# Patient Record
Sex: Male | Born: 1950 | Race: Black or African American | Hispanic: No | Marital: Married | Smoking: Current some day smoker
Health system: Southern US, Community
[De-identification: ages and names within clinical notes are randomized; demographics above are authoritative.]

## PROBLEM LIST (undated history)

## (undated) DIAGNOSIS — R7611 Nonspecific reaction to tuberculin skin test without active tuberculosis: Secondary | ICD-10-CM

## (undated) DIAGNOSIS — I10 Essential (primary) hypertension: Secondary | ICD-10-CM

## (undated) DIAGNOSIS — E785 Hyperlipidemia, unspecified: Secondary | ICD-10-CM

## (undated) HISTORY — DX: Hyperlipidemia, unspecified: E78.5

## (undated) HISTORY — DX: Essential (primary) hypertension: I10

## (undated) HISTORY — DX: Nonspecific reaction to tuberculin skin test without active tuberculosis: R76.11

---

## 1997-10-03 ENCOUNTER — Emergency Department (HOSPITAL_COMMUNITY): Admission: EM | Admit: 1997-10-03 | Discharge: 1997-10-03 | Payer: Self-pay | Admitting: Emergency Medicine

## 2003-06-30 ENCOUNTER — Encounter: Admission: RE | Admit: 2003-06-30 | Discharge: 2003-06-30 | Payer: Self-pay | Admitting: Family Medicine

## 2005-02-28 ENCOUNTER — Ambulatory Visit: Payer: Self-pay | Admitting: Gastroenterology

## 2005-03-24 ENCOUNTER — Ambulatory Visit: Payer: Self-pay | Admitting: Gastroenterology

## 2005-03-24 LAB — HM COLONOSCOPY: HM Colonoscopy: NORMAL

## 2005-05-15 ENCOUNTER — Ambulatory Visit: Payer: Self-pay | Admitting: Gastroenterology

## 2005-05-26 ENCOUNTER — Encounter: Admission: RE | Admit: 2005-05-26 | Discharge: 2005-05-26 | Payer: Self-pay | Admitting: Family Medicine

## 2005-08-11 ENCOUNTER — Emergency Department (HOSPITAL_COMMUNITY): Admission: EM | Admit: 2005-08-11 | Discharge: 2005-08-11 | Payer: Self-pay | Admitting: Emergency Medicine

## 2005-08-21 ENCOUNTER — Emergency Department (HOSPITAL_COMMUNITY): Admission: EM | Admit: 2005-08-21 | Discharge: 2005-08-21 | Payer: Self-pay | Admitting: Emergency Medicine

## 2006-09-07 ENCOUNTER — Ambulatory Visit: Payer: Self-pay | Admitting: Family Medicine

## 2007-03-01 ENCOUNTER — Ambulatory Visit: Payer: Self-pay | Admitting: Family Medicine

## 2007-04-16 ENCOUNTER — Ambulatory Visit: Payer: Self-pay | Admitting: Family Medicine

## 2007-09-09 ENCOUNTER — Ambulatory Visit: Payer: Self-pay | Admitting: Family Medicine

## 2008-09-07 ENCOUNTER — Ambulatory Visit: Payer: Self-pay | Admitting: Family Medicine

## 2008-09-18 ENCOUNTER — Ambulatory Visit: Payer: Self-pay | Admitting: Family Medicine

## 2008-10-06 ENCOUNTER — Ambulatory Visit: Payer: Self-pay | Admitting: Family Medicine

## 2008-10-12 ENCOUNTER — Encounter: Admission: RE | Admit: 2008-10-12 | Discharge: 2008-10-12 | Payer: Self-pay | Admitting: Family Medicine

## 2008-12-29 ENCOUNTER — Encounter: Admission: RE | Admit: 2008-12-29 | Discharge: 2008-12-29 | Payer: Self-pay | Admitting: Family Medicine

## 2008-12-29 ENCOUNTER — Ambulatory Visit: Payer: Self-pay | Admitting: Family Medicine

## 2009-06-21 ENCOUNTER — Ambulatory Visit: Payer: Self-pay | Admitting: Family Medicine

## 2009-09-09 ENCOUNTER — Ambulatory Visit: Payer: Self-pay | Admitting: Family Medicine

## 2010-09-09 ENCOUNTER — Encounter: Payer: Self-pay | Admitting: Family Medicine

## 2010-09-13 ENCOUNTER — Ambulatory Visit (INDEPENDENT_AMBULATORY_CARE_PROVIDER_SITE_OTHER): Payer: No Typology Code available for payment source | Admitting: Family Medicine

## 2010-09-13 ENCOUNTER — Encounter: Payer: Self-pay | Admitting: Family Medicine

## 2010-09-13 ENCOUNTER — Other Ambulatory Visit: Payer: Self-pay | Admitting: Family Medicine

## 2010-09-13 VITALS — BP 124/90 | HR 88 | Ht 68.0 in | Wt 159.0 lb

## 2010-09-13 DIAGNOSIS — Z2911 Encounter for prophylactic immunotherapy for respiratory syncytial virus (RSV): Secondary | ICD-10-CM

## 2010-09-13 DIAGNOSIS — Z79899 Other long term (current) drug therapy: Secondary | ICD-10-CM

## 2010-09-13 DIAGNOSIS — T24229A Burn of second degree of unspecified knee, initial encounter: Secondary | ICD-10-CM

## 2010-09-13 DIAGNOSIS — N529 Male erectile dysfunction, unspecified: Secondary | ICD-10-CM | POA: Insufficient documentation

## 2010-09-13 DIAGNOSIS — Z0000 Encounter for general adult medical examination without abnormal findings: Secondary | ICD-10-CM

## 2010-09-13 DIAGNOSIS — Z209 Contact with and (suspected) exposure to unspecified communicable disease: Secondary | ICD-10-CM

## 2010-09-13 DIAGNOSIS — R195 Other fecal abnormalities: Secondary | ICD-10-CM

## 2010-09-13 DIAGNOSIS — I10 Essential (primary) hypertension: Secondary | ICD-10-CM | POA: Insufficient documentation

## 2010-09-13 DIAGNOSIS — T24222A Burn of second degree of left knee, initial encounter: Secondary | ICD-10-CM

## 2010-09-13 LAB — POCT URINALYSIS DIPSTICK
Blood, UA: NEGATIVE
Glucose, UA: NEGATIVE
Leukocytes, UA: NEGATIVE
Nitrite, UA: NEGATIVE
Spec Grav, UA: 1.02
Urobilinogen, UA: NEGATIVE

## 2010-09-13 LAB — COMPREHENSIVE METABOLIC PANEL
ALT: 35 U/L (ref 0–53)
AST: 57 U/L — ABNORMAL HIGH (ref 0–37)
Albumin: 4.6 g/dL (ref 3.5–5.2)
Alkaline Phosphatase: 65 U/L (ref 39–117)
BUN: 10 mg/dL (ref 6–23)
Creat: 0.8 mg/dL (ref 0.50–1.35)
Glucose, Bld: 118 mg/dL — ABNORMAL HIGH (ref 70–99)
Sodium: 135 mEq/L (ref 135–145)
Total Protein: 7.8 g/dL (ref 6.0–8.3)

## 2010-09-13 LAB — LIPID PANEL
Cholesterol: 232 mg/dL — ABNORMAL HIGH (ref 0–200)
HDL: 76 mg/dL (ref >39)
Total CHOL/HDL Ratio: 3.1 Ratio

## 2010-09-13 MED ORDER — AMLODIPINE BESYLATE 10 MG PO TABS: 10.0000 mg | ORAL_TABLET | Freq: Every day | ORAL | Status: DC

## 2010-09-13 NOTE — Patient Instructions (Addendum)
To help with your problem, cut back on alcohol and smoking. Let he know how the medication works.
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
## 2010-09-14 LAB — CBC WITH DIFFERENTIAL/PLATELET
Eosinophils Relative: 1 % (ref 0–5)
Hemoglobin: 14.1 g/dL (ref 13.0–17.0)
MCH: 29.9 pg (ref 26.0–34.0)
MCHC: 33.7 g/dL (ref 30.0–36.0)
Monocytes Absolute: 0.6 10*3/uL (ref 0.1–1.0)
Monocytes Relative: 8 % (ref 3–12)
Neutro Abs: 5.2 10*3/uL (ref 1.7–7.7)
Neutrophils Relative %: 72 % (ref 43–77)
RBC: 4.71 MIL/uL (ref 4.22–5.81)

## 2010-09-14 LAB — HEMOGLOBIN A1C: Mean Plasma Glucose: 117 mg/dL — ABNORMAL HIGH (ref <117)

## 2010-09-15 ENCOUNTER — Telehealth: Payer: Self-pay

## 2010-09-15 NOTE — Telephone Encounter (Signed)
Left message for pt labs okay but sending copy of labs and chiolesterol diet info

## 2010-09-19 ENCOUNTER — Other Ambulatory Visit: Payer: Self-pay | Admitting: Family Medicine

## 2010-09-19 MED ORDER — VARDENAFIL HCL 10 MG PO TBDP: 1.0000 | ORAL_TABLET | ORAL | Status: DC

## 2010-09-28 ENCOUNTER — Telehealth: Payer: Self-pay | Admitting: Family Medicine

## 2010-09-28 NOTE — Telephone Encounter (Signed)
Pt has appt Monday aug 20th at 10 30 with dr hung

## 2010-09-28 NOTE — Telephone Encounter (Signed)
Pt has appt Monday 20th @ 1030 pt is aware

## 2010-10-17 HISTORY — PX: COLONOSCOPY: SHX174

## 2010-10-27 LAB — HM COLONOSCOPY

## 2010-10-28 ENCOUNTER — Encounter: Payer: Self-pay | Admitting: Family Medicine

## 2011-02-24 ENCOUNTER — Telehealth: Payer: Self-pay | Admitting: Family Medicine

## 2011-02-24 NOTE — Telephone Encounter (Signed)
Have him check with his insurance company's to see which one they cover better. Let him know there are no generics

## 2011-02-24 NOTE — Telephone Encounter (Signed)
Left message for pt to call ins company and find out what they cover and call us back when he finds out

## 2011-04-27 ENCOUNTER — Other Ambulatory Visit: Payer: Self-pay | Admitting: Family Medicine

## 2011-09-07 ENCOUNTER — Encounter: Payer: Self-pay | Admitting: Internal Medicine

## 2011-09-14 ENCOUNTER — Encounter: Payer: Self-pay | Admitting: Family Medicine

## 2011-09-14 ENCOUNTER — Ambulatory Visit (INDEPENDENT_AMBULATORY_CARE_PROVIDER_SITE_OTHER): Payer: No Typology Code available for payment source | Admitting: Family Medicine

## 2011-09-14 VITALS — BP 116/80 | HR 78 | Ht 68.0 in | Wt 172.0 lb

## 2011-09-14 DIAGNOSIS — E785 Hyperlipidemia, unspecified: Secondary | ICD-10-CM

## 2011-09-14 DIAGNOSIS — R7309 Other abnormal glucose: Secondary | ICD-10-CM

## 2011-09-14 DIAGNOSIS — F17200 Nicotine dependence, unspecified, uncomplicated: Secondary | ICD-10-CM

## 2011-09-14 DIAGNOSIS — Z9079 Acquired absence of other genital organ(s): Secondary | ICD-10-CM

## 2011-09-14 DIAGNOSIS — Z0000 Encounter for general adult medical examination without abnormal findings: Secondary | ICD-10-CM

## 2011-09-14 DIAGNOSIS — R7302 Impaired glucose tolerance (oral): Secondary | ICD-10-CM

## 2011-09-14 LAB — LIPID PANEL
Cholesterol: 212 mg/dL — ABNORMAL HIGH (ref 0–200)
HDL: 50 mg/dL (ref >39)
LDL Cholesterol: 145 mg/dL — ABNORMAL HIGH (ref 0–99)
Triglycerides: 84 mg/dL (ref <150)
VLDL: 17 mg/dL (ref 0–40)

## 2011-09-14 LAB — CBC WITH DIFFERENTIAL/PLATELET
Basophils Absolute: 0 10*3/uL (ref 0.0–0.1)
Basophils Relative: 0 % (ref 0–1)
Eosinophils Relative: 4 % (ref 0–5)
Hemoglobin: 13.2 g/dL (ref 13.0–17.0)
Lymphocytes Relative: 31 % (ref 12–46)
MCHC: 35 g/dL (ref 30.0–36.0)
Monocytes Absolute: 0.5 10*3/uL (ref 0.1–1.0)
Monocytes Relative: 10 % (ref 3–12)
RBC: 4.52 MIL/uL (ref 4.22–5.81)
WBC: 5 10*3/uL (ref 4.0–10.5)

## 2011-09-14 LAB — POCT URINALYSIS DIPSTICK
Bilirubin, UA: NEGATIVE
Blood, UA: NEGATIVE
Glucose, UA: NEGATIVE
Leukocytes, UA: NEGATIVE
Nitrite, UA: NEGATIVE
Urobilinogen, UA: NEGATIVE

## 2011-09-14 LAB — COMPREHENSIVE METABOLIC PANEL
AST: 29 U/L (ref 0–37)
Alkaline Phosphatase: 48 U/L (ref 39–117)
BUN: 8 mg/dL (ref 6–23)
Creat: 0.73 mg/dL (ref 0.50–1.35)
Glucose, Bld: 100 mg/dL — ABNORMAL HIGH (ref 70–99)
Potassium: 4.2 mEq/L (ref 3.5–5.3)
Total Protein: 7.2 g/dL (ref 6.0–8.3)

## 2011-09-14 NOTE — Assessment & Plan Note (Signed)
Undescended right testes with removal at age 61

## 2011-09-14 NOTE — Progress Notes (Signed)
  Subjective:    Patient ID: William Blackwell, male    DOB: 1951/01/05, 61 y.o.   MRN: [id number]  HPI He is here for a complete examination. He continues on his blood pressure medications and is having no difficulty with them.he will occasionally use his ED medication. He does have lesions present in right nostril as well as on the inside of his mouth that he would like me to evaluate.  Review of Systems  Constitutional: Negative.   HENT: Negative.   Eyes: Negative.   Respiratory: Negative.   Cardiovascular: Negative.   Gastrointestinal: Negative.   Genitourinary: Negative.   Musculoskeletal: Negative.   Skin: Negative.   Neurological: Negative.   Hematological: Negative.   Psychiatric/Behavioral: Negative.        Objective:   Physical Exam BP 116/80  Pulse 78  Ht 5\' 8"  (1.727 m)  Wt 172 lb (78.019 kg)  BMI 26.15 kg/m2  SpO2 99%  General Appearance:    Alert, cooperative, no distress, appears stated age  Head:    Normocephalic, without obvious abnormality, atraumatic  Eyes:    PERRL, conjunctiva/corneas clear, EOM's intact, fundi    benign  Ears:    Normal TM's and external ear canals  Nose:   Nares normal, mucosa normal, no drainage or sinus   Tenderness.the tip of the right nares does have a healing lesion.   Throat:   Lips, and tongue normal; teeth and gums normal.0.1 cm lesions noted in the left lip near the corner of the mouth   Neck:   Supple, no lymphadenopathy;  thyroid:  no   enlargement/tenderness/nodules; no carotid   bruit or JVD  Back:    Spine nontender, no curvature, ROM normal, no CVA     tenderness  Lungs:     Clear to auscultation bilaterally without wheezes, rales or     ronchi; respirations unlabored  Chest Wall:    No tenderness or deformity   Heart:    Regular rate and rhythm, S1 and S2 normal, no murmur, rub   or gallop  Breast Exam:    No chest wall tenderness, masses or gynecomastia  Abdomen:     Soft, non-tender, nondistended, normoactive  bowel sounds,    no masses, no hepatosplenomegaly  Genitalia:    Normal male external genitalia without lesions. Left Testicle without masses, right testes missing No inguinal hernias.  Rectal:    deferred   Extremities:   No clubbing, cyanosis or edema  Pulses:   2+ and symmetric all extremities  Skin:   Skin color, texture, turgor normal, no rashes or lesions  Lymph nodes:   Cervical, supraclavicular, and axillary nodes normal  Neurologic:   CNII-XII intact, normal strength, sensation and gait; reflexes 2+ and symmetric throughout          Psych:   Normal mood, affect, hygiene and grooming.      EKG :WNL    Assessment & Plan:   1. Routine general medical examination at a health care facility  POCT Urinalysis Dipstick, PR ELECTROCARDIOGRAM, COMPLETE, CBC with Differential, Comprehensive metabolic panel, Lipid panel  2. S/P orchiectomy    3. Glucose intolerance (impaired glucose tolerance)  Comprehensive metabolic panel  4. Hyperlipidemia LDL goal < 100  Lipid panel  5. Current smoker    6 ED the nasal and lip lesions appear benign.

## 2011-09-15 NOTE — Progress Notes (Signed)
Mailed pt copy of labs with diet info

## 2011-11-11 ENCOUNTER — Other Ambulatory Visit: Payer: Self-pay | Admitting: Family Medicine

## 2011-11-13 NOTE — Telephone Encounter (Signed)
Is this ok?

## 2011-11-27 ENCOUNTER — Encounter: Payer: Self-pay | Admitting: Family Medicine

## 2011-11-27 ENCOUNTER — Ambulatory Visit (INDEPENDENT_AMBULATORY_CARE_PROVIDER_SITE_OTHER): Payer: No Typology Code available for payment source | Admitting: Family Medicine

## 2011-11-27 VITALS — BP 114/70 | HR 86 | Wt 174.0 lb

## 2011-11-27 DIAGNOSIS — M549 Dorsalgia, unspecified: Secondary | ICD-10-CM

## 2011-11-27 MED ORDER — VARDENAFIL HCL 10 MG PO TABS: 20.0000 mg | ORAL_TABLET | ORAL | Status: DC | PRN

## 2011-11-27 NOTE — Patient Instructions (Signed)
2 Aleve twice a day, heat for 20 minutes 3 times per day and after you are done  with heat do some retching for the next week or 2. If no improvement return here

## 2011-11-27 NOTE — Progress Notes (Signed)
  Subjective:    Patient ID: William Blackwell, male    DOB: [birth year], 61 y.o.   MRN: [id number]  HPI He has a one-week history of right hip pain that is now radiating down his leg. No history of,. No numbness, tingling or weakness. He has not taken anything to help this.   Review of Systems     Objective:   Physical Exam Alert and in no distress. No tenderness over lumbar spine or SI joints. Good motion of his spine to questionably positive stork test. DTRs normal. Straight leg raising negative.       Assessment & Plan:  Back pain possible sacroiliitis. Recommend heat, stretching and anti-inflammatory. Continued difficulty, he should return here.

## 2011-11-30 ENCOUNTER — Ambulatory Visit
Admission: RE | Admit: 2011-11-30 | Discharge: 2011-11-30 | Disposition: A | Discharge status: Home / Self Care | Payer: No Typology Code available for payment source | Source: Ambulatory Visit | Attending: Family Medicine | Admitting: Family Medicine

## 2011-11-30 ENCOUNTER — Ambulatory Visit (INDEPENDENT_AMBULATORY_CARE_PROVIDER_SITE_OTHER): Payer: No Typology Code available for payment source | Admitting: Family Medicine

## 2011-11-30 ENCOUNTER — Encounter: Payer: Self-pay | Admitting: Family Medicine

## 2011-11-30 VITALS — BP 110/70 | HR 76 | Wt 174.0 lb

## 2011-11-30 DIAGNOSIS — M549 Dorsalgia, unspecified: Secondary | ICD-10-CM

## 2011-11-30 DIAGNOSIS — N529 Male erectile dysfunction, unspecified: Secondary | ICD-10-CM

## 2011-11-30 MED ORDER — SILDENAFIL CITRATE 100 MG PO TABS: 50.0000 mg | ORAL_TABLET | Freq: Every day | ORAL | Status: DC | PRN

## 2011-11-30 MED ORDER — TRAMADOL HCL 50 MG PO TABS: 50.0000 mg | ORAL_TABLET | Freq: Four times a day (QID) | ORAL | Status: DC | PRN

## 2011-11-30 NOTE — Progress Notes (Signed)
  Subjective:    Patient ID: William Blackwell, male    DOB: Jun 24, 1950, 61 y.o.   MRN: [id number]  HPI He was seen several days ago and treated conservatively for his back pain. He has been taking 2 Aleve twice per day with no relief. He has been using heat for 20 minutes twice per day. He states that the pain does radiate down his legs. He demonstrated this by bending over and touching his toes. He also received a letter from his insurance coming stating that they will no longer pay for Levitra. He has tried fiber in the past which has worked.   Review of Systems     Objective:   Physical Exam Alert and in no distress. No tenderness to palpation. No rash noted. Negative straight leg raising. Normal hip motion. Reflexes were difficult to assess.       Assessment & Plan:   1. Back pain  DG Lumbar Spine Complete, traMADol (ULTRAM) 50 MG tablet  2. ED (erectile dysfunction)  sildenafil (VIAGRA) 100 MG tablet

## 2011-12-28 ENCOUNTER — Other Ambulatory Visit: Payer: Self-pay | Admitting: Orthopedic Surgery

## 2011-12-28 DIAGNOSIS — M545 Low back pain: Secondary | ICD-10-CM

## 2011-12-31 ENCOUNTER — Ambulatory Visit
Admission: RE | Admit: 2011-12-31 | Discharge: 2011-12-31 | Disposition: A | Discharge status: Home / Self Care | Payer: No Typology Code available for payment source | Source: Ambulatory Visit | Attending: Orthopedic Surgery | Admitting: Orthopedic Surgery

## 2011-12-31 DIAGNOSIS — M545 Low back pain: Secondary | ICD-10-CM

## 2012-02-19 ENCOUNTER — Institutional Professional Consult (permissible substitution): Payer: No Typology Code available for payment source | Admitting: Family Medicine

## 2012-05-04 ENCOUNTER — Other Ambulatory Visit: Payer: Self-pay | Admitting: Family Medicine

## 2012-08-23 ENCOUNTER — Ambulatory Visit (INDEPENDENT_AMBULATORY_CARE_PROVIDER_SITE_OTHER): Payer: 59 | Admitting: Family Medicine

## 2012-08-23 ENCOUNTER — Encounter: Payer: Self-pay | Admitting: Family Medicine

## 2012-08-23 VITALS — BP 128/86 | HR 90 | Wt 178.0 lb

## 2012-08-23 DIAGNOSIS — N529 Male erectile dysfunction, unspecified: Secondary | ICD-10-CM

## 2012-08-23 DIAGNOSIS — I10 Essential (primary) hypertension: Secondary | ICD-10-CM

## 2012-08-23 DIAGNOSIS — Z0000 Encounter for general adult medical examination without abnormal findings: Secondary | ICD-10-CM

## 2012-08-23 DIAGNOSIS — Z209 Contact with and (suspected) exposure to unspecified communicable disease: Secondary | ICD-10-CM

## 2012-08-23 LAB — POCT URINALYSIS DIPSTICK
Glucose, UA: NEGATIVE
Spec Grav, UA: 1.015

## 2012-08-23 LAB — CBC WITH DIFFERENTIAL/PLATELET
Basophils Relative: 0 % (ref 0–1)
Eosinophils Relative: 1 % (ref 0–5)
HCT: 36.4 % — ABNORMAL LOW (ref 39.0–52.0)
Hemoglobin: 12.6 g/dL — ABNORMAL LOW (ref 13.0–17.0)
MCHC: 34.6 g/dL (ref 30.0–36.0)
MCV: 81.1 fL (ref 78.0–100.0)
Monocytes Absolute: 0.5 10*3/uL (ref 0.1–1.0)
Platelets: 263 10*3/uL (ref 150–400)
RBC: 4.49 MIL/uL (ref 4.22–5.81)
RDW: 14.2 % (ref 11.5–15.5)

## 2012-08-23 LAB — HIV ANTIBODY (ROUTINE TESTING W REFLEX): HIV: NONREACTIVE

## 2012-08-23 LAB — LIPID PANEL
Cholesterol: 192 mg/dL (ref 0–200)
LDL Cholesterol: 132 mg/dL — ABNORMAL HIGH (ref 0–99)
Triglycerides: 87 mg/dL (ref <150)

## 2012-08-23 LAB — COMPREHENSIVE METABOLIC PANEL
ALT: 29 U/L (ref 0–53)
BUN: 9 mg/dL (ref 6–23)
Calcium: 9.7 mg/dL (ref 8.4–10.5)
Sodium: 138 mEq/L (ref 135–145)
Total Protein: 7.3 g/dL (ref 6.0–8.3)

## 2012-08-23 NOTE — Progress Notes (Signed)
  Subjective:    Patient ID: William Blackwell, male    DOB: 07/08/1950, 62 y.o.   MRN: [id number]  HPI He is here for complete examination. He has enjoyed excellent health and has no major concerns. He continues on his blood pressure medication without difficulty. He does occasionally use I agree. He is dating to different people at the present time and would like to be STD testing. Social and family history were reviewed. Work is going well. He does plan on retiring within the next several years.   Review of Systems  Constitutional: Negative.   HENT: Negative.   Eyes: Negative.   Respiratory: Negative.   Cardiovascular: Negative.   Gastrointestinal: Negative.   Endocrine: Negative.   Genitourinary: Negative.   Allergic/Immunologic: Negative.   Neurological: Negative.   Hematological: Negative.   Psychiatric/Behavioral: Negative.        Objective:   Physical Exam BP 128/86  Pulse 90  Wt 178 lb (80.74 kg)  BMI 27.07 kg/m2  SpO2 98%  General Appearance:    Alert, cooperative, no distress, appears stated age  Head:    Normocephalic, without obvious abnormality, atraumatic  Eyes:    PERRL, conjunctiva/corneas clear, EOM's intact, fundi    benign  Ears:    Normal TM's and external ear canals  Nose:   Nares normal, mucosa normal, no drainage or sinus   tenderness  Throat:   Lips, mucosa, and tongue normal; teeth and gums normal  Neck:   Supple, no lymphadenopathy;  thyroid:  no   enlargement/tenderness/nodules; no carotid   bruit or JVD  Back:    Spine nontender, no curvature, ROM normal, no CVA     tenderness  Lungs:     Clear to auscultation bilaterally without wheezes, rales or     ronchi; respirations unlabored  Chest Wall:    No tenderness or deformity   Heart:    Regular rate and rhythm, S1 and S2 normal, no murmur, rub   or gallop  Breast Exam:    No chest wall tenderness, masses or gynecomastia  Abdomen:     Soft, non-tender, nondistended, normoactive bowel sounds,     no masses, no hepatosplenomegaly  Genitalia:  deferred  Rectal:   deferred   Extremities:   No clubbing, cyanosis or edema  Pulses:   2+ and symmetric all extremities  Skin:   Skin color, texture, turgor normal, no rashes or lesions  Lymph nodes:   Cervical, supraclavicular, and axillary nodes normal  Neurologic:   CNII-XII intact, normal strength, sensation and gait; reflexes 2+ and symmetric throughout          Psych:   Normal mood, affect, hygiene and grooming.          Assessment & Plan:  Hypertension - Plan: POCT Urinalysis Dipstick  ED (erectile dysfunction)  Routine general medical examination at a health care facility - Plan: CBC with Differential, Comprehensive metabolic panel, Lipid panel  Contact with or exposure to unspecified communicable disease - Plan: HIV antibody, RPR

## 2012-08-26 NOTE — Progress Notes (Signed)
Quick Note:  Sent pt letter of results ______ 

## 2012-11-29 ENCOUNTER — Telehealth: Payer: Self-pay | Admitting: Family Medicine

## 2012-11-29 DIAGNOSIS — N529 Male erectile dysfunction, unspecified: Secondary | ICD-10-CM

## 2012-11-29 NOTE — Telephone Encounter (Signed)
Pt needs refill on Viagra send to [facility]. Pt was informed that JCL not in office and he stated ok until Monday so sending it to him.

## 2012-11-30 MED ORDER — SILDENAFIL CITRATE 100 MG PO TABS: 100.0000 mg | ORAL_TABLET | Freq: Every day | ORAL | Status: DC | PRN

## 2013-03-24 ENCOUNTER — Emergency Department (HOSPITAL_BASED_OUTPATIENT_CLINIC_OR_DEPARTMENT_OTHER)
Admission: EM | Admit: 2013-03-24 | Discharge: 2013-03-24 | Disposition: A | Discharge status: Home / Self Care | Attending: Emergency Medicine | Admitting: Emergency Medicine

## 2013-03-24 ENCOUNTER — Encounter (HOSPITAL_BASED_OUTPATIENT_CLINIC_OR_DEPARTMENT_OTHER): Payer: Self-pay | Admitting: Emergency Medicine

## 2013-03-24 ENCOUNTER — Ambulatory Visit (INDEPENDENT_AMBULATORY_CARE_PROVIDER_SITE_OTHER): Payer: 59 | Admitting: Family Medicine

## 2013-03-24 ENCOUNTER — Encounter: Payer: Self-pay | Admitting: Family Medicine

## 2013-03-24 VITALS — BP 130/78 | HR 72 | Wt 159.0 lb

## 2013-03-24 DIAGNOSIS — I10 Essential (primary) hypertension: Secondary | ICD-10-CM | POA: Insufficient documentation

## 2013-03-24 DIAGNOSIS — F17200 Nicotine dependence, unspecified, uncomplicated: Secondary | ICD-10-CM | POA: Diagnosis not present

## 2013-03-24 DIAGNOSIS — Z79899 Other long term (current) drug therapy: Secondary | ICD-10-CM | POA: Diagnosis not present

## 2013-03-24 DIAGNOSIS — Z862 Personal history of diseases of the blood and blood-forming organs and certain disorders involving the immune mechanism: Secondary | ICD-10-CM | POA: Diagnosis not present

## 2013-03-24 DIAGNOSIS — Z8639 Personal history of other endocrine, nutritional and metabolic disease: Secondary | ICD-10-CM | POA: Insufficient documentation

## 2013-03-24 DIAGNOSIS — M549 Dorsalgia, unspecified: Secondary | ICD-10-CM

## 2013-03-24 DIAGNOSIS — Z7982 Long term (current) use of aspirin: Secondary | ICD-10-CM | POA: Insufficient documentation

## 2013-03-24 DIAGNOSIS — IMO0002 Reserved for concepts with insufficient information to code with codable children: Secondary | ICD-10-CM | POA: Diagnosis not present

## 2013-03-24 DIAGNOSIS — M5410 Radiculopathy, site unspecified: Secondary | ICD-10-CM

## 2013-03-24 DIAGNOSIS — M545 Low back pain, unspecified: Secondary | ICD-10-CM | POA: Diagnosis present

## 2013-03-24 MED ORDER — IBUPROFEN 800 MG PO TABS: 800.0000 mg | ORAL_TABLET | Freq: Three times a day (TID) | ORAL | Status: DC

## 2013-03-24 MED ORDER — CYCLOBENZAPRINE HCL 10 MG PO TABS: 10.0000 mg | ORAL_TABLET | Freq: Two times a day (BID) | ORAL | Status: DC | PRN

## 2013-03-24 NOTE — ED Provider Notes (Signed)
CSN: [id number]     Arrival date & time 03/24/13  1220 History   First MD Initiated Contact with Patient 03/24/13 1315     Chief Complaint  Patient presents with  . Leg Pain    right     (Consider location/radiation/quality/duration/timing/severity/associated sxs/prior Treatment) Patient is a 63 y.o. male presenting with leg pain. The history is provided by the patient. No language interpreter was used.  Leg Pain Location:  Hip and leg Injury: no   Hip location:  R hip Associated symptoms: back pain   Associated symptoms: no fever   Associated symptoms comment:  Right low back, hip and leg pain that started 2-3 days ago after heavy lifting at work. No weakness. The pain is worse in certain positions. No swelling, redness or fever. No urinary or bowel incontinence.   Past Medical History  Diagnosis Date  . Hypertension   . Dyslipidemia   . PPD positive, treated    Past Surgical History  Procedure Laterality Date  . Colonoscopy  10/17/10   Family History  Problem Relation Age of Onset  . Cancer Mother   . Hypertension Mother   . Cancer Father   . Cancer Maternal Grandmother    History  Substance Use Topics  . Smoking status: Current Every Day Smoker    Types: Cigarettes, Cigars  . Smokeless tobacco: Never Used  . Alcohol Use: 8.4 oz/week    14 Cans of beer per week    Review of Systems  Constitutional: Negative for fever and chills.  HENT: Negative.   Respiratory: Negative.   Cardiovascular: Negative.   Gastrointestinal: Negative.   Musculoskeletal: Positive for back pain.       See HPI  Skin: Negative.   Neurological: Negative.       Allergies  Review of patient's allergies indicates no known allergies.  Home Medications   Current Outpatient Rx  Name  Route  Sig  Dispense  Refill  . amLODipine (NORVASC) 10 MG tablet      TAKE 1 TABLET EVERY DAY   90 tablet   PRN   . aspirin 81 MG EC tablet   Oral   Take 81 mg by mouth daily.           .  Multiple Vitamins-Minerals (MULTIVITAMIN WITH MINERALS) tablet   Oral   Take 1 tablet by mouth daily.           . sildenafil (VIAGRA) 100 MG tablet   Oral   Take 1 tablet (100 mg total) by mouth daily as needed for erectile dysfunction.   5 tablet   11   . oxyCODONE-acetaminophen (PERCOCET) 10-325 MG per tablet   Oral   Take 1 tablet by mouth every 4 (four) hours as needed for pain.          BP 128/86  Temp(Src) 98.8 F (37.1 C) (Oral)  Resp 20  Ht 5\' 8"  (1.727 m)  Wt 159 lb (72.122 kg)  BMI 24.18 kg/m2  SpO2 100% Physical Exam  Constitutional: He is oriented to person, place, and time. He appears well-developed and well-nourished.  Neck: Normal range of motion.  Pulmonary/Chest: Effort normal.  Abdominal: Soft. He exhibits no mass. There is no tenderness.  Musculoskeletal: Normal range of motion.  No paralumbar tenderness or swelling, no discoloration. No sciatic tenderness on right. Distal pulses 2+. No calf tenderness or swelling. Thigh is mildly tender along medial and lateral aspects, also without swelling.  Neurological: He is alert and oriented to  person, place, and time. He has normal reflexes. No sensory deficit.  Skin: Skin is warm and dry.  Psychiatric: He has a normal mood and affect.    ED Course  Procedures (including critical care time) Labs Review Labs Reviewed - No data to display Imaging Review No results found.  EKG Interpretation   None       MDM   Final diagnoses:  None    1. Low back pain 2. Radiculopathy, right leg  Neurologic exam is nonfocal. No strength deficits. No swelling or redness as well as pain is position related - doubt DVT. Suspect radicular pain after heavy lifting. Will treat conservatively with primary care follow up.    Arnoldo Hooker, PA-C 03/24/13 1419

## 2013-03-24 NOTE — Patient Instructions (Signed)
[employer] supervisor and report this injury. I will work with you if [employer] allows that.

## 2013-03-24 NOTE — ED Notes (Signed)
Patient states he lifted a heavy object at work two days ago and now has pain on the right hip down the right side of his leg to the top of his right foot. Denies any back pain.

## 2013-03-24 NOTE — Progress Notes (Signed)
   Subjective:    Patient ID: William Blackwell, male    DOB: 1950-03-25, 63 y.o.   MRN: [id number]  HPI He is here for evaluation of low back pain that started last Wednesday while he was working. The pain has gotten worse. He has a previous history of difficulty with this and has seen orthopedics. He works at the [employer]. He has yet to report this is a work-related injury. I then stopped the encounter and encouraged him to go to the proper procedures to protect himself. I told him that I will work with him if workman's comp will cover him coming here but strongly encouraged him to make sure he calls his supervisor and follows proper procedure.   Review of Systems     Objective:   Physical Exam        Assessment & Plan:

## 2013-03-24 NOTE — ED Provider Notes (Signed)
Medical screening examination/treatment/procedure(s) were performed by non-physician practitioner and as supervising physician I was immediately available for consultation/collaboration.  EKG Interpretation   None         Johnathyn Viscomi, MD 03/24/13 1429 

## 2013-03-24 NOTE — Discharge Instructions (Signed)
CONTINUE TAKING YOUR PERCOCET ALONG WITH NEW PRESCRIPTIONS. FOLLOW UP WITH DR. Redmond School IF PAIN IS NO BETTER IN 2-3 DAYS.   Back Exercises Back exercises help treat and prevent back injuries. The goal of back exercises is to increase the strength of your abdominal and back muscles and the flexibility of your back. These exercises should be started when you no longer have back pain. Back exercises include:  Pelvic Tilt. Lie on your back with your knees bent. Tilt your pelvis until the lower part of your back is against the floor. Hold this position 5 to 10 sec and repeat 5 to 10 times.  Knee to Chest. Pull first 1 knee up against your chest and hold for 20 to 30 seconds, repeat this with the other knee, and then both knees. This may be done with the other leg straight or bent, whichever feels better.  Sit-Ups or Curl-Ups. Bend your knees 90 degrees. Start with tilting your pelvis, and do a partial, slow sit-up, lifting your trunk only 30 to 45 degrees off the floor. Take at least 2 to 3 seconds for each sit-up. Do not do sit-ups with your knees out straight. If partial sit-ups are difficult, simply do the above but with only tightening your abdominal muscles and holding it as directed.  Hip-Lift. Lie on your back with your knees flexed 90 degrees. Push down with your feet and shoulders as you raise your hips a couple inches off the floor; hold for 10 seconds, repeat 5 to 10 times.  Back arches. Lie on your stomach, propping yourself up on bent elbows. Slowly press on your hands, causing an arch in your low back. Repeat 3 to 5 times. Any initial stiffness and discomfort should lessen with repetition over time.  Shoulder-Lifts. Lie face down with arms beside your body. Keep hips and torso pressed to floor as you slowly lift your head and shoulders off the floor. Do not overdo your exercises, especially in the beginning. Exercises may cause you some mild back discomfort which lasts for a few minutes;  however, if the pain is more severe, or lasts for more than 15 minutes, do not continue exercises until you see your caregiver. Improvement with exercise therapy for back problems is slow.  See your caregivers for assistance with developing a proper back exercise program. Document Released: 03/09/2004 Document Revised: 04/24/2011 Document Reviewed: 12/01/2010 [facility] Patient Information 2014 [place].  Back Injury Prevention Back injuries can be extremely painful and difficult to heal. After having one back injury, you are much more likely to experience another later on. It is important to learn how to avoid injuring or re-injuring your back. The following tips can help you to prevent a back injury. PHYSICAL FITNESS  Exercise regularly and try to develop good tone in your abdominal muscles. Your abdominal muscles provide a lot of the support needed by your back.  Do aerobic exercises (walking, jogging, biking, swimming) regularly.  Do exercises that increase balance and strength (tai chi, yoga) regularly. This can decrease your risk of falling and injuring your back.  Stretch before and after exercising.  Maintain a healthy weight. The more you weigh, the more stress is placed on your back. For every pound of weight, 10 times that amount of pressure is placed on the back. DIET  Talk to your caregiver about how much calcium and vitamin D you need per day. These nutrients help to prevent weakening of the bones (osteoporosis). Osteoporosis can cause broken (fractured) bones that  lead to back pain.  Include good sources of calcium in your diet, such as dairy products, green, leafy vegetables, and products with calcium added (fortified).  Include good sources of vitamin D in your diet, such as milk and foods that are fortified with vitamin D.  Consider taking a nutritional supplement or a multivitamin if needed.  Stop smoking if you smoke. POSTURE  Sit and stand up straight. Avoid  leaning forward when you sit or hunching over when you stand.  Choose chairs with good low back (lumbar) support.  If you work at a desk, sit close to your work so you do not need to lean over. Keep your chin tucked in. Keep your neck drawn back and elbows bent at a right angle. Your arms should look like the letter "L."  Sit high and close to the steering wheel when you drive. Add a lumbar support to your car seat if needed.  Avoid sitting or standing in one position for too long. Take breaks to get up, stretch, and walk around at least once every hour. Take breaks if you are driving for long periods of time.  Sleep on your side with your knees slightly bent, or sleep on your back with a pillow under your knees. Do not sleep on your stomach. LIFTING, TWISTING, AND REACHING  Avoid heavy lifting, especially repetitive lifting. If you must do heavy lifting:  Stretch before lifting.  Work slowly.  Rest between lifts.  Use carts and dollies to move objects when possible.  Make several small trips instead of carrying 1 heavy load.  Ask for help when you need it.  Ask for help when moving big, awkward objects.  Follow these steps when lifting:  Stand with your feet shoulder-width apart.  Get as close to the object as you can. Do not try to pick up heavy objects that are far from your body.  Use handles or lifting straps if they are available.  Bend at your knees. Squat down, but keep your heels off the floor.  Keep your shoulders pulled back, your chin tucked in, and your back straight.  Lift the object slowly, tightening the muscles in your legs, abdomen, and buttocks. Keep the object as close to the center of your body as possible.  When you put a load down, use these same guidelines in reverse.  Do not:  Lift the object above your waist.  Twist at the waist while lifting or carrying a load. Move your feet if you need to turn, not your waist.  Bend over without bending  at your knees.  Avoid reaching over your head, across a table, or for an object on a high surface. OTHER TIPS  Avoid wet floors and keep sidewalks clear of ice to prevent falls.  Do not sleep on a mattress that is too soft or too hard.  Keep items that are used frequently within easy reach.  Put heavier objects on shelves at waist level and lighter objects on lower or higher shelves.  Find ways to decrease your stress, such as exercise, massage, or relaxation techniques. Stress can build up in your muscles. Tense muscles are more vulnerable to injury.  Seek treatment for depression or anxiety if needed. These conditions can increase your risk of developing back pain. SEEK MEDICAL CARE IF:  You injure your back.  You have questions about diet, exercise, or other ways to prevent back injuries. MAKE SURE YOU:  Understand these instructions.  Will watch your condition.  Will get help right away if you are not doing well or get worse. Document Released: 03/09/2004 Document Revised: 04/24/2011 Document Reviewed: 03/13/2011 [facility] Patient Information 2014 [place], [place].

## 2013-04-01 ENCOUNTER — Encounter: Payer: Self-pay | Admitting: Family Medicine

## 2013-04-01 ENCOUNTER — Ambulatory Visit (INDEPENDENT_AMBULATORY_CARE_PROVIDER_SITE_OTHER): Payer: 59 | Admitting: Family Medicine

## 2013-04-01 VITALS — BP 128/88 | HR 120 | Wt 162.0 lb

## 2013-04-01 DIAGNOSIS — M549 Dorsalgia, unspecified: Secondary | ICD-10-CM

## 2013-04-01 MED ORDER — IBUPROFEN 800 MG PO TABS: 800.0000 mg | ORAL_TABLET | Freq: Three times a day (TID) | ORAL | Status: DC

## 2013-04-01 NOTE — Progress Notes (Signed)
   Subjective:    Patient ID: William Blackwell, male    DOB: [birth year], 63 y.o.   MRN: [id number]  HPI He is here for recheck on back pain. He was seen in the emergency room on the ninth. 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 related problem. He was given a muscle relaxer and ibuprofen. He also had some OxyContin at home. He states that he is 50% better. He complains of right hip pain as well as heel calf pain. No numbness, tingling or weakness.   Review of Systems     Objective:   Physical Exam Alert and in no distress. Normal lumbar curvature. Normal lumbar motion. Hip motion is normal. Normal motor, sensory and DTRs with negative straight leg raising.      Assessment & Plan:  Back pain - Plan: ibuprofen (ADVIL,MOTRIN) 800 MG tablet  he will continue on his Advil. I did not renew his muscle relaxer. Instructed him on proper lifting technique especially with keeping his back straight. He will be given a note to return to work on Friday.

## 2013-04-15 ENCOUNTER — Ambulatory Visit (INDEPENDENT_AMBULATORY_CARE_PROVIDER_SITE_OTHER): Payer: 59 | Admitting: Family Medicine

## 2013-04-15 ENCOUNTER — Encounter: Payer: Self-pay | Admitting: Family Medicine

## 2013-04-15 VITALS — BP 124/82 | HR 84 | Wt 155.0 lb

## 2013-04-15 DIAGNOSIS — M549 Dorsalgia, unspecified: Secondary | ICD-10-CM

## 2013-04-15 MED ORDER — CYCLOBENZAPRINE HCL 10 MG PO TABS: 10.0000 mg | ORAL_TABLET | Freq: Every day | ORAL | Status: DC

## 2013-04-15 MED ORDER — TRAMADOL HCL 50 MG PO TABS: 50.0000 mg | ORAL_TABLET | Freq: Three times a day (TID) | ORAL | Status: DC | PRN

## 2013-04-15 NOTE — Progress Notes (Signed)
   Subjective:    Patient ID: William Blackwell, male    DOB: Oct 12, 1950, 63 y.o.   MRN: [id number]  HPI He is here for recheck. He states he is now 63% better but still having some right-sided back pain. He states that the pain tends to wax and wane while he is working. He notes that lifting heavy objects makes it worse He cannot lay on his right side due to the pain. He continues on ibuprofen 800 mg 3 times a day.  Review of Systems     Objective:   Physical Exam Back exam shows questionable tenderness over the right SI joint and sciatic notch. Normal lumbar motion. Negative straight leg raising. Normal hip motion. DTRs were difficult to elicit but appear normal.       Assessment & Plan:  Back pain - Plan: traMADol (ULTRAM) 50 MG tablet, cyclobenzaprine (FLEXERIL) 10 MG tablet  after discussion with him I mentioned the possibility of physical therapy. He would prefer to see if he can handle this with more pain medication. He is to take the Flexeril only at night. Cautioned him about the pain medication and working around machinery. Recheck here in 2 weeks.

## 2013-04-16 ENCOUNTER — Telehealth: Payer: Self-pay | Admitting: Family Medicine

## 2013-04-18 NOTE — Telephone Encounter (Signed)
Meds were called into pharmacy

## 2013-04-29 ENCOUNTER — Ambulatory Visit (INDEPENDENT_AMBULATORY_CARE_PROVIDER_SITE_OTHER): Payer: 59 | Admitting: Family Medicine

## 2013-04-29 ENCOUNTER — Encounter: Payer: Self-pay | Admitting: Family Medicine

## 2013-04-29 VITALS — BP 114/80 | HR 88 | Wt 153.0 lb

## 2013-04-29 DIAGNOSIS — M549 Dorsalgia, unspecified: Secondary | ICD-10-CM

## 2013-04-29 NOTE — Patient Instructions (Signed)
You should return to normal over the next month and if there is any question then come back

## 2013-04-29 NOTE — Progress Notes (Signed)
   Subjective:    Patient ID: William Blackwell, male    DOB: 12-17-1950, 63 y.o.   MRN: [id number]  HPI He is here for recheck. He states he is now 90% better. He is working full-time with no restrictions. He still does occasionally take an ibuprofen stating 2 or 3 times per week. He is quite happy with progress if he has made.   Review of Systems     Objective:   Physical Exam Alert and in no distress. Good motion of his back without pain to       Assessment & Plan:  Back pain  he is scheduled for complete exam in July. Instructed him to call me if not totally back to normal in one month.

## 2013-05-13 ENCOUNTER — Other Ambulatory Visit: Payer: Self-pay | Admitting: Family Medicine

## 2013-08-25 ENCOUNTER — Encounter: Payer: Self-pay | Admitting: Family Medicine

## 2013-08-25 ENCOUNTER — Ambulatory Visit (INDEPENDENT_AMBULATORY_CARE_PROVIDER_SITE_OTHER): Payer: 59 | Admitting: Family Medicine

## 2013-08-25 VITALS — BP 132/80 | HR 104 | Ht 68.0 in | Wt 151.0 lb

## 2013-08-25 DIAGNOSIS — N529 Male erectile dysfunction, unspecified: Secondary | ICD-10-CM

## 2013-08-25 DIAGNOSIS — Z0000 Encounter for general adult medical examination without abnormal findings: Secondary | ICD-10-CM

## 2013-08-25 DIAGNOSIS — Z209 Contact with and (suspected) exposure to unspecified communicable disease: Secondary | ICD-10-CM

## 2013-08-25 DIAGNOSIS — I10 Essential (primary) hypertension: Secondary | ICD-10-CM

## 2013-08-25 LAB — CBC WITH DIFFERENTIAL/PLATELET
BASOS ABS: 0 10*3/uL (ref 0.0–0.1)
BASOS PCT: 0 % (ref 0–1)
Eosinophils Absolute: 0 10*3/uL (ref 0.0–0.7)
Eosinophils Relative: 1 % (ref 0–5)
HCT: 40.6 % (ref 39.0–52.0)
Hemoglobin: 14.1 g/dL (ref 13.0–17.0)
Lymphocytes Relative: 34 % (ref 12–46)
Lymphs Abs: 1.6 10*3/uL (ref 0.7–4.0)
MCH: 29.8 pg (ref 26.0–34.0)
MCHC: 34.7 g/dL (ref 30.0–36.0)
MCV: 85.8 fL (ref 78.0–100.0)
Monocytes Absolute: 0.4 10*3/uL (ref 0.1–1.0)
Monocytes Relative: 9 % (ref 3–12)
NEUTROS ABS: 2.6 10*3/uL (ref 1.7–7.7)
NEUTROS PCT: 56 % (ref 43–77)
PLATELETS: 315 10*3/uL (ref 150–400)
RBC: 4.73 MIL/uL (ref 4.22–5.81)
RDW: 13.1 % (ref 11.5–15.5)
WBC: 4.7 10*3/uL (ref 4.0–10.5)

## 2013-08-25 LAB — POCT URINALYSIS DIPSTICK
BILIRUBIN UA: NEGATIVE
Blood, UA: NEGATIVE
GLUCOSE UA: NEGATIVE
KETONES UA: NEGATIVE
LEUKOCYTES UA: NEGATIVE
NITRITE UA: NEGATIVE
PH UA: 5
Protein, UA: NEGATIVE
Spec Grav, UA: 1.01
Urobilinogen, UA: NEGATIVE

## 2013-08-25 NOTE — Progress Notes (Signed)
   Subjective:    Patient ID: William Blackwell, male    DOB: 1950/09/25, 63 y.o.   MRN: [id number]  HPI He is here for complete examination. He has had some unprotected sexual activity over the last year with women and would like to be tested. He continues on his blood pressure medication. He apparently is not having difficulty with ED. He has no other concerns or complaints. Family and social history were reviewed. His immunizations and health maintenance for also reviewed and all are up to date. His work is going well. He is considering retiring next year. He does plan to keep quite busy.   Review of Systems  All other systems reviewed and are negative.      Objective:   Physical Exam BP 132/80  Pulse 104  Ht 5\' 8"  (1.727 m)  Wt 151 lb (68.493 kg)  BMI 22.96 kg/m2  SpO2 98%  General Appearance:    Alert, cooperative, no distress, appears stated age  Head:    Normocephalic, without obvious abnormality, atraumatic  Eyes:    PERRL, conjunctiva/corneas clear, EOM's intact, fundi    benign  Ears:     I encouraged him to wear condoms with sexual activity.Normal TM's and external ear canals  Nose:   Nares normal, mucosa normal, no drainage or sinus   tenderness  Throat:   Lips, mucosa, and tongue normal; teeth and gums normal  Neck:   Supple, no lymphadenopathy;  thyroid:  no   enlargement/tenderness/nodules; no carotid   bruit or JVD  Back:    Spine nontender, no curvature, ROM normal, no CVA     tenderness  Lungs:     Clear to auscultation bilaterally without wheezes, rales or     ronchi; respirations unlabored  Chest Wall:    No tenderness or deformity   Heart:    Regular rate and rhythm, S1 and S2 normal, no murmur, rub   or gallop  Breast Exam:    No chest wall tenderness, masses or gynecomastia  Abdomen:     Soft, non-tender, nondistended, normoactive bowel sounds,    no masses, no hepatosplenomegaly        Extremities:   No clubbing, cyanosis or edema  Pulses:   2+ and  symmetric all extremities  Skin:   Skin color, texture, turgor normal, no rashes or lesions  Lymph nodes:   Cervical, supraclavicular, and axillary nodes normal  Neurologic:   CNII-XII intact, normal strength, sensation and gait; reflexes 2+ and symmetric throughout          Psych:   Normal mood, affect, hygiene and grooming.          Assessment & Plan:  Routine general medical examination at a health care facility - Plan: POCT Urinalysis Dipstick, CBC with Differential, Comprehensive metabolic panel, Lipid panel  Contact with or exposure to unspecified communicable disease - Plan: RPR, HIV antibody  Essential hypertension - Plan: CBC with Differential, Comprehensive metabolic panel  Erectile dysfunction, unspecified erectile dysfunction type

## 2013-08-26 LAB — COMPREHENSIVE METABOLIC PANEL
ALK PHOS: 65 U/L (ref 39–117)
ALT: 37 U/L (ref 0–53)
AST: 46 U/L — AB (ref 0–37)
Albumin: 4.3 g/dL (ref 3.5–5.2)
BILIRUBIN TOTAL: 0.5 mg/dL (ref 0.2–1.2)
BUN: 7 mg/dL (ref 6–23)
CO2: 25 mEq/L (ref 19–32)
Calcium: 9.3 mg/dL (ref 8.4–10.5)
Chloride: 102 mEq/L (ref 96–112)
Creat: 0.74 mg/dL (ref 0.50–1.35)
GLUCOSE: 87 mg/dL (ref 70–99)
POTASSIUM: 4.1 meq/L (ref 3.5–5.3)
SODIUM: 139 meq/L (ref 135–145)
Total Protein: 7.5 g/dL (ref 6.0–8.3)

## 2013-08-26 LAB — LIPID PANEL
Cholesterol: 187 mg/dL (ref 0–200)
HDL: 58 mg/dL (ref >39)
LDL CALC: 101 mg/dL — AB (ref 0–99)
TRIGLYCERIDES: 138 mg/dL (ref <150)
Total CHOL/HDL Ratio: 3.2 Ratio
VLDL: 28 mg/dL (ref 0–40)

## 2013-08-26 LAB — HIV ANTIBODY (ROUTINE TESTING W REFLEX): HIV: NONREACTIVE

## 2013-08-26 LAB — RPR

## 2014-05-09 ENCOUNTER — Other Ambulatory Visit: Payer: Self-pay | Admitting: Family Medicine

## 2014-10-29 ENCOUNTER — Ambulatory Visit: Payer: 59 | Admitting: Family Medicine

## 2014-10-30 ENCOUNTER — Encounter: Payer: Self-pay | Admitting: Family Medicine

## 2014-10-30 ENCOUNTER — Ambulatory Visit (INDEPENDENT_AMBULATORY_CARE_PROVIDER_SITE_OTHER): Payer: Worker's Compensation | Admitting: Family Medicine

## 2014-10-30 VITALS — BP 122/80 | HR 85 | Ht 68.5 in | Wt 146.0 lb

## 2014-10-30 DIAGNOSIS — S6010XA Contusion of unspecified finger with damage to nail, initial encounter: Secondary | ICD-10-CM

## 2014-10-30 DIAGNOSIS — S6000XA Contusion of unspecified finger without damage to nail, initial encounter: Secondary | ICD-10-CM | POA: Diagnosis not present

## 2014-10-30 NOTE — Progress Notes (Signed)
   Subjective:    Patient ID: William Blackwell, male    DOB: December 11, 1950, 64 y.o.   MRN: [id number]  HPI He injured the right third finger nail on 9/9Getting it caught between 2 pieces of metal. He sustained some swelling and discoloration underneath the nail.   Review of Systems     Objective:   Physical Exam The right third fingernail does show evidence of blood underneath the nail.       Assessment & Plan:  Subungual hematoma of digit of hand, initial encounter The tip of the paperclip was heated and a hole burned through the nail bed with relief of the pressure. Some bloody fluid was expressed. Palpation of the fingertip after the hematoma was removed showed no tenderness. He is cleared to return to full duty. Apparently he has been working in spite of this. No further intervention necessary.

## 2014-11-01 ENCOUNTER — Other Ambulatory Visit: Payer: Self-pay | Admitting: Family Medicine

## 2014-11-10 ENCOUNTER — Other Ambulatory Visit: Payer: Self-pay

## 2014-11-10 ENCOUNTER — Telehealth: Payer: Self-pay | Admitting: Family Medicine

## 2014-11-10 MED ORDER — AMLODIPINE BESYLATE 5 MG PO TABS: ORAL_TABLET | ORAL | Status: DC

## 2014-11-10 NOTE — Telephone Encounter (Signed)
Pt states that he attempted to get a refill on [email or web address]  and was told that the company that manufactures this med has stopped making it. Pt only has one pill left for tomorrow. He wants to know what Dr Susann Givens wants to do about this med.

## 2014-11-10 NOTE — Telephone Encounter (Signed)
Its generic and that should be able to get it from somebody. Call the pharmacy and find out what is going on with this.

## 2014-11-10 NOTE — Telephone Encounter (Signed)
Dr.Lalonde this med is on back order the past month so pharmacist said we could do the 5 mg and for pt to take two once a day so I told him that would be fine

## 2014-12-07 ENCOUNTER — Ambulatory Visit (INDEPENDENT_AMBULATORY_CARE_PROVIDER_SITE_OTHER): Payer: 59 | Admitting: Family Medicine

## 2014-12-07 ENCOUNTER — Encounter: Payer: Self-pay | Admitting: Family Medicine

## 2014-12-07 VITALS — BP 120/70 | HR 86 | Ht 69.0 in | Wt 141.0 lb

## 2014-12-07 DIAGNOSIS — Z0000 Encounter for general adult medical examination without abnormal findings: Secondary | ICD-10-CM

## 2014-12-07 DIAGNOSIS — R634 Abnormal weight loss: Secondary | ICD-10-CM | POA: Diagnosis not present

## 2014-12-07 DIAGNOSIS — N5201 Erectile dysfunction due to arterial insufficiency: Secondary | ICD-10-CM | POA: Diagnosis not present

## 2014-12-07 DIAGNOSIS — I10 Essential (primary) hypertension: Secondary | ICD-10-CM

## 2014-12-07 LAB — LIPID PANEL
CHOLESTEROL: 190 mg/dL (ref 125–200)
HDL: 66 mg/dL (ref >=40)
LDL Cholesterol: 114 mg/dL (ref <130)
TRIGLYCERIDES: 49 mg/dL (ref <150)
Total CHOL/HDL Ratio: 2.9 Ratio (ref <=5.0)
VLDL: 10 mg/dL (ref <30)

## 2014-12-07 LAB — CBC WITH DIFFERENTIAL/PLATELET
BASOS ABS: 0 10*3/uL (ref 0.0–0.1)
BASOS PCT: 1 % (ref 0–1)
EOS ABS: 0 10*3/uL (ref 0.0–0.7)
Eosinophils Relative: 1 % (ref 0–5)
HCT: 44.7 % (ref 39.0–52.0)
HEMOGLOBIN: 15 g/dL (ref 13.0–17.0)
LYMPHS ABS: 1.2 10*3/uL (ref 0.7–4.0)
Lymphocytes Relative: 28 % (ref 12–46)
MCH: 29.4 pg (ref 26.0–34.0)
MCHC: 33.6 g/dL (ref 30.0–36.0)
MCV: 87.5 fL (ref 78.0–100.0)
MPV: 8.6 fL (ref 8.6–12.4)
Monocytes Absolute: 0.4 10*3/uL (ref 0.1–1.0)
Monocytes Relative: 9 % (ref 3–12)
Neutro Abs: 2.7 10*3/uL (ref 1.7–7.7)
Neutrophils Relative %: 61 % (ref 43–77)
PLATELETS: 322 10*3/uL (ref 150–400)
RBC: 5.11 MIL/uL (ref 4.22–5.81)
RDW: 12.9 % (ref 11.5–15.5)
WBC: 4.4 10*3/uL (ref 4.0–10.5)

## 2014-12-07 LAB — COMPREHENSIVE METABOLIC PANEL
ALBUMIN: 4 g/dL (ref 3.6–5.1)
ALK PHOS: 68 U/L (ref 40–115)
ALT: 20 U/L (ref 9–46)
AST: 23 U/L (ref 10–35)
BILIRUBIN TOTAL: 0.5 mg/dL (ref 0.2–1.2)
BUN: 10 mg/dL (ref 7–25)
CHLORIDE: 103 mmol/L (ref 98–110)
CO2: 29 mmol/L (ref 20–31)
CREATININE: 0.78 mg/dL (ref 0.70–1.25)
Calcium: 9.4 mg/dL (ref 8.6–10.3)
Glucose, Bld: 89 mg/dL (ref 65–99)
Potassium: 4.1 mmol/L (ref 3.5–5.3)
SODIUM: 140 mmol/L (ref 135–146)
TOTAL PROTEIN: 7.1 g/dL (ref 6.1–8.1)

## 2014-12-07 MED ORDER — AMLODIPINE BESYLATE 5 MG PO TABS: ORAL_TABLET | ORAL | Status: DC

## 2014-12-07 MED ORDER — TADALAFIL 20 MG PO TABS: 20.0000 mg | ORAL_TABLET | ORAL | Status: DC | PRN

## 2014-12-07 NOTE — Progress Notes (Signed)
   Subjective:    Patient ID: William Blackwell, male    DOB: 1951/01/13, 64 y.o.   MRN: [id number]  HPI He is here for a complete examination. His main concern today is weight loss. Review his record indicates that he has had weight loss over the last year or 2. He has had no abdominal pain, nausea, vomiting, change in bowel habits, early satiety. He's had no cough, congestion, urinary symptoms. He did have a colonoscopy in 2012. He does drink beer on a regular basis but states that this has not changed. He does have ED and in the past had used Viagra with good results. He continues on amlodipine and is having no difficulty with that. He is single but sexually active. Family and social history was reviewed as well as health maintenance and immunizations.   Review of Systems  All other systems reviewed and are negative.      Objective:   Physical Exam BP 120/70 mmHg  Pulse 86  Ht 5\' 9"  (1.753 m)  Wt 141 lb (63.957 kg)  BMI 20.81 kg/m2  SpO2 98%  General Appearance:    Alert, cooperative, no distress, appears stated age  Head:    Normocephalic, without obvious abnormality, atraumatic  Eyes:    PERRL, conjunctiva/corneas clear, EOM's intact, fundi    benign  Ears:    Normal TM's and external ear canals  Nose:   Nares normal, mucosa normal, no drainage or sinus   tenderness  Throat:   Lips, mucosa, and tongue normal; teeth and gums normal  Neck:   Supple, no lymphadenopathy;  thyroid:  no   enlargement/tenderness/nodules; no carotid   bruit or JVD  Back:    Spine nontender, no curvature, ROM normal, no CVA     tenderness  Lungs:     Clear to auscultation bilaterally without wheezes, rales or     ronchi; respirations unlabored  Chest Wall:    No tenderness or deformity   Heart:    Regular rate and rhythm, S1 and S2 normal, no murmur, rub   or gallop  Breast Exam:    No chest wall tenderness, masses or gynecomastia  Abdomen:     Soft, non-tender, nondistended, questionable  high-pitched tinkling bowel sounds noted,    no masses, no hepatosplenomegaly        Extremities:   No clubbing, cyanosis or edema  Pulses:   2+ and symmetric all extremities  Skin:   Skin color, texture, turgor normal, no rashes or lesions  Lymph nodes:   Cervical, supraclavicular, and axillary nodes normal  Neurologic:   CNII-XII intact, normal strength, sensation and gait; reflexes 2+ and symmetric throughout          Psych:   Normal mood, affect, hygiene and grooming.          Assessment & Plan:  Routine general medical examination at a health care facility - Plan: CBC with Differential/Platelet, Comprehensive metabolic panel, Lipid panel, PSA  Erectile dysfunction due to arterial insufficiency - Plan: tadalafil (CIALIS) 20 MG tablet  Weight loss - Plan: CBC with Differential/Platelet, Comprehensive metabolic panel, PSA  Essential hypertension - Plan: CBC with Differential/Platelet, Comprehensive metabolic panel, Lipid panel  weight loss is quite disconcerting. I will wait on the blood work before further evaluation and probably refer to GI if normal.

## 2014-12-08 ENCOUNTER — Other Ambulatory Visit: Payer: Self-pay

## 2014-12-08 DIAGNOSIS — R634 Abnormal weight loss: Secondary | ICD-10-CM

## 2014-12-08 LAB — PSA: PSA: 1.3 ng/mL (ref <=4.00)

## 2014-12-10 ENCOUNTER — Encounter: Payer: Self-pay | Admitting: Physician Assistant

## 2015-01-05 ENCOUNTER — Ambulatory Visit (INDEPENDENT_AMBULATORY_CARE_PROVIDER_SITE_OTHER)
Admission: RE | Admit: 2015-01-05 | Discharge: 2015-01-05 | Disposition: A | Discharge status: Home / Self Care | Payer: No Typology Code available for payment source | Source: Ambulatory Visit | Attending: Physician Assistant | Admitting: Physician Assistant

## 2015-01-05 ENCOUNTER — Encounter: Payer: Self-pay | Admitting: Physician Assistant

## 2015-01-05 ENCOUNTER — Ambulatory Visit (INDEPENDENT_AMBULATORY_CARE_PROVIDER_SITE_OTHER): Payer: No Typology Code available for payment source | Admitting: Physician Assistant

## 2015-01-05 ENCOUNTER — Other Ambulatory Visit (INDEPENDENT_AMBULATORY_CARE_PROVIDER_SITE_OTHER): Payer: No Typology Code available for payment source

## 2015-01-05 VITALS — BP 102/60 | HR 80 | Ht 68.0 in | Wt 141.8 lb

## 2015-01-05 DIAGNOSIS — R634 Abnormal weight loss: Secondary | ICD-10-CM

## 2015-01-05 LAB — TSH: TSH: 2.11 u[IU]/mL (ref 0.35–4.50)

## 2015-01-05 NOTE — Progress Notes (Signed)
I agree with the above plan, note 

## 2015-01-05 NOTE — Progress Notes (Signed)
Patient ID: William Blackwell, male   DOB: November 18, 1950, 64 y.o.   MRN: [id number]   Subjective:    Patient ID: William Blackwell, male    DOB: March 01, 1950, 64 y.o.   MRN: [id number]  HPI  William Blackwell is a pleasant 64 year old African-American male referred today by Dr. Susann Givens for evaluation of weight loss. Patient has history of hypertension, and had been seen here by Dr. Christella Hartigan in 2007 for screening colonoscopy. That was a normal exam. He has been otherwise healthy, he is a smoker. He says he switched to third shift this year and his eating habits have been altered,and  he says he is also under increased stress because he has a cousin living with him now. He says over the past 5-6 months lost about 20 pounds. Patient feels fine says his appetite is good ,he has no complaints of nausea or vomiting ,no heartburn i,ndigestion dysphagia ,or odynophagia. He has no complaints of abdominal discomfort, changes in bowel habits, melena or hematochezia. Recent labs done October 2016 with CBC and CMETboth unremarkable.  Review of Systems Pertinent positive and negative review of systems were noted in the above HPI section.  All other review of systems was otherwise negative.  Outpatient Encounter Prescriptions as of 01/05/2015  Medication Sig  . amLODipine (NORVASC) 5 MG tablet PATIENT IS TO TAKE TWO 5 MG TABS TO = 10 MG  DAILY  . aspirin 81 MG EC tablet Take 81 mg by mouth daily.    . Multiple Vitamins-Minerals (MULTIVITAMIN WITH MINERALS) tablet Take 1 tablet by mouth daily.    . tadalafil (CIALIS) 20 MG tablet Take 1 tablet (20 mg total) by mouth as needed for erectile dysfunction.   No facility-administered encounter medications on file as of 01/05/2015.   No Known Allergies Patient Active Problem List   Diagnosis Date Noted  . 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 Hypertension 09/13/2010  . ED (erectile dysfunction) 09/13/2010   Social History   Social History  . Marital Status: Married    Spouse Name:  N/A  . Number of Children: N/A  . Years of Education: N/A   Occupational History  . Not on file.   Social History Main Topics  . Smoking status: Current Every Day Smoker    Types: Cigarettes, Cigars  . Smokeless tobacco: Never Used     Comment: form given 01/05/15   . Alcohol Use: 8.4 oz/week    14 Cans of beer per week  . Drug Use: No  . Sexual Activity: Yes   Other Topics Concern  . Not on file   Social History Narrative    Mr. Schanz family history includes Breast cancer in his maternal grandmother; Cancer in his father; Diabetes in his cousin and paternal aunt; Hypertension in his mother; Lung cancer in his mother; Ovarian cancer in his mother.      Objective:    Filed Vitals:   01/05/15 0850  BP: 102/60  Pulse: 80    Physical Exam  well-developed older African-American male in no acute distress, pleasant blood pressure 102/60 pulse 80 height 5 foot 8 weight 141. HEENT; nontraumatic the cephalic EOMI PERRLA sclera anicteric, Cardiovascular ;regular rate and rhythm with S1-S2 no murmur or gallop, Pulmonary; clear bilaterally, Abdomen ;soft nontender nondistended bowel sounds are active there is no palpable mass or hepatosplenomegaly exam not done, Ext;no clubbing cyanosis or edema skin warm and dry, Neuropsych ;mood and affect appropriate       Assessment & Plan:   #1 64  yo AA male with 20 pound weight loss over past 6 months,unintentional- asymptomatic- R/O occult malignancy Pt has had a change in work schedule and living situation -possible weight loss is situational  #2 colon screening - normal colonoscopy 2007  Plan; TSH CXR Ct abd/pelvis with contrast Schedule for Colonoscopy with Dr Christella HartiganJacobs- procedure discussed in detail with pt and he is agreeable to proceed.   Chezney Huether Oswald HillockS Denijah Karrer PA-C 01/05/2015   Cc: Ronnald NianLalonde, John C, MD

## 2015-01-05 NOTE — Patient Instructions (Signed)
Please go to the basement level to have your labs drawn and go to Radiology Department also for a chest x-ray.   You have been scheduled for a colonoscopy. Please follow written instructions given to you at your visit today.  Please pick up your prep supplies at the pharmacy within the next 1-3 days. [place] If you use inhalers (even only as needed), please bring them with you on the day of your procedure. Your physician has requested that you go to www.startemmi.com and enter the access code given to you at your visit today. This web site gives a general overview about your procedure. However, you should still follow specific instructions given to you by our office regarding your preparation for the procedure.   You have been scheduled for a CT scan of the abdomen and pelvis at [facility] ([address]---this is in the same building as Press photographer).   You are scheduled on 01-12-2015 at 8:30 am . You should arrive 15 minutes prior to your appointment time for registration. Please follow the written instructions below on the day of your exam:  WARNING: IF YOU ARE ALLERGIC TO IODINE/X-RAY DYE, PLEASE NOTIFY RADIOLOGY IMMEDIATELY AT [phone]! YOU WILL BE GIVEN A 13 HOUR PREMEDICATION PREP.  1) Do not eat or drink anything after 4:30 am  (4 hours prior to your test) 2) You have been given 2 bottles of oral contrast to drink. The solution may taste better if refrigerated, but do NOT add ice or any other liquid to this solution. Shake well before drinking.    Drink 1 bottle of contrast @ 6:30 am  (2 hours prior to your exam)  Drink 1 bottle of contrast @ 7:30 am  (1 hour prior to your exam)  You may take any medications as prescribed with a small amount of water except for the following: Metformin, Glucophage, Glucovance, Avandamet, Riomet, Fortamet, Actoplus Met, Janumet, Glumetza or Metaglip. The above medications must be held the day of the exam AND 48  hours after the exam.  The purpose of you drinking the oral contrast is to aid in the visualization of your intestinal tract. The contrast solution may cause some diarrhea. Before your exam is started, you will be given a small amount of fluid to drink. Depending on your individual set of symptoms, you may also receive an intravenous injection of x-ray contrast/dye. Plan on being at [facility] for 30 minutes or long, depending on the type of exam you are having performed.  If you have any questions regarding your exam or if you need to reschedule, you may call the CT department at [phone] between the hours of 8:00 am and 5:00 pm, Monday-Friday.  ________________________________________________________________________

## 2015-01-07 ENCOUNTER — Encounter (HOSPITAL_COMMUNITY): Payer: Self-pay

## 2015-01-07 ENCOUNTER — Emergency Department (HOSPITAL_COMMUNITY)
Admission: EM | Admit: 2015-01-07 | Discharge: 2015-01-07 | Disposition: A | Discharge status: Home / Self Care | Payer: No Typology Code available for payment source | Attending: Emergency Medicine | Admitting: Emergency Medicine

## 2015-01-07 DIAGNOSIS — Z79899 Other long term (current) drug therapy: Secondary | ICD-10-CM | POA: Insufficient documentation

## 2015-01-07 DIAGNOSIS — F17210 Nicotine dependence, cigarettes, uncomplicated: Secondary | ICD-10-CM | POA: Diagnosis not present

## 2015-01-07 DIAGNOSIS — F1410 Cocaine abuse, uncomplicated: Secondary | ICD-10-CM | POA: Insufficient documentation

## 2015-01-07 DIAGNOSIS — I10 Essential (primary) hypertension: Secondary | ICD-10-CM | POA: Diagnosis not present

## 2015-01-07 DIAGNOSIS — Z8639 Personal history of other endocrine, nutritional and metabolic disease: Secondary | ICD-10-CM | POA: Insufficient documentation

## 2015-01-07 DIAGNOSIS — F1910 Other psychoactive substance abuse, uncomplicated: Secondary | ICD-10-CM

## 2015-01-07 DIAGNOSIS — Z008 Encounter for other general examination: Secondary | ICD-10-CM

## 2015-01-07 DIAGNOSIS — Z0289 Encounter for other administrative examinations: Secondary | ICD-10-CM | POA: Diagnosis present

## 2015-01-07 DIAGNOSIS — Z7982 Long term (current) use of aspirin: Secondary | ICD-10-CM | POA: Insufficient documentation

## 2015-01-07 LAB — URINALYSIS, ROUTINE W REFLEX MICROSCOPIC
BILIRUBIN URINE: NEGATIVE
Glucose, UA: NEGATIVE mg/dL
HGB URINE DIPSTICK: NEGATIVE
Ketones, ur: NEGATIVE mg/dL
Leukocytes, UA: NEGATIVE
Nitrite: NEGATIVE
PROTEIN: NEGATIVE mg/dL
Specific Gravity, Urine: 1.007 (ref 1.005–1.030)
pH: 6 (ref 5.0–8.0)

## 2015-01-07 LAB — CBC WITH DIFFERENTIAL/PLATELET
BASOS ABS: 0 10*3/uL (ref 0.0–0.1)
Basophils Relative: 1 %
Eosinophils Absolute: 0 10*3/uL (ref 0.0–0.7)
Eosinophils Relative: 1 %
HEMATOCRIT: 40.7 % (ref 39.0–52.0)
Hemoglobin: 14.2 g/dL (ref 13.0–17.0)
LYMPHS ABS: 1.5 10*3/uL (ref 0.7–4.0)
LYMPHS PCT: 35 %
MCH: 29.3 pg (ref 26.0–34.0)
MCHC: 34.9 g/dL (ref 30.0–36.0)
MCV: 83.9 fL (ref 78.0–100.0)
MONO ABS: 0.4 10*3/uL (ref 0.1–1.0)
MONOS PCT: 10 %
NEUTROS ABS: 2.2 10*3/uL (ref 1.7–7.7)
Neutrophils Relative %: 53 %
Platelets: 275 10*3/uL (ref 150–400)
RBC: 4.85 MIL/uL (ref 4.22–5.81)
RDW: 12.4 % (ref 11.5–15.5)
WBC: 4.1 10*3/uL (ref 4.0–10.5)

## 2015-01-07 LAB — SALICYLATE LEVEL: Salicylate Lvl: <4 mg/dL (ref 2.8–30.0)

## 2015-01-07 LAB — COMPREHENSIVE METABOLIC PANEL
ALBUMIN: 4 g/dL (ref 3.5–5.0)
ALT: 29 U/L (ref 17–63)
AST: 38 U/L (ref 15–41)
Alkaline Phosphatase: 62 U/L (ref 38–126)
Anion gap: 11 (ref 5–15)
CHLORIDE: 102 mmol/L (ref 101–111)
CO2: 24 mmol/L (ref 22–32)
Calcium: 9.6 mg/dL (ref 8.9–10.3)
Creatinine, Ser: 0.74 mg/dL (ref 0.61–1.24)
GFR calc Af Amer: >60 mL/min (ref >60)
GFR calc non Af Amer: >60 mL/min (ref >60)
GLUCOSE: 106 mg/dL — AB (ref 65–99)
POTASSIUM: 3.8 mmol/L (ref 3.5–5.1)
SODIUM: 137 mmol/L (ref 135–145)
Total Bilirubin: 0.7 mg/dL (ref 0.3–1.2)
Total Protein: 7.2 g/dL (ref 6.5–8.1)

## 2015-01-07 LAB — RAPID URINE DRUG SCREEN, HOSP PERFORMED
AMPHETAMINES: NOT DETECTED
BARBITURATES: NOT DETECTED
BENZODIAZEPINES: NOT DETECTED
Cocaine: POSITIVE — AB
Opiates: NOT DETECTED
Tetrahydrocannabinol: NOT DETECTED

## 2015-01-07 LAB — LIPASE, BLOOD: LIPASE: 30 U/L (ref 11–51)

## 2015-01-07 LAB — ACETAMINOPHEN LEVEL

## 2015-01-07 NOTE — ED Provider Notes (Signed)
CSN: [id number]     Arrival date & time 01/07/15  1100 History   First MD Initiated Contact with Patient 01/07/15 1120     Chief Complaint  Patient presents with  . Medical Clearance     (Consider location/radiation/quality/duration/timing/severity/associated sxs/prior Treatment) HPI Rachelle HoraDaniel Lee Canizares is a 64 y.o. male history of hypertension, dyslipidemia comes in for evaluation of substance abuse. Patient reports he's been using crack cocaine for the past 5 months and would like resources to help with detox. He reports he needs medical clearance at this time in order to get the facility. He denies any other medical problems at this time. Denies any fevers, chills, chest pain or shortness of breath, abdominal pain, nausea or vomiting, diaphoresis, numbness or weakness, urinary symptoms. Last used crack cocaine this morning at 10:00 AM. Reports he drinks 2-3 beers per day, no benzodiazepine use or other illicit drug use. No suicidal or homicidal ideation.  Past Medical History  Diagnosis Date  . Hypertension   . Dyslipidemia   . PPD positive, treated    Past Surgical History  Procedure Laterality Date  . Colonoscopy  10/17/10   Family History  Problem Relation Age of Onset  . Ovarian cancer Mother   . Hypertension Mother   . Cancer Father     abdominal??  . Breast cancer Maternal Grandmother   . Lung cancer Mother     mets from ovaries  . Diabetes Paternal Aunt   . Diabetes Cousin    Social History  Substance Use Topics  . Smoking status: Current Every Day Smoker -- 0.25 packs/day    Types: Cigarettes, Cigars  . Smokeless tobacco: Never Used     Comment: form given 01/05/15   . Alcohol Use: 8.4 oz/week    14 Cans of beer per week    Review of Systems A 10 point review of systems was completed and was negative except for pertinent positives and negatives as mentioned in the history of present illness     Allergies  Review of patient's allergies indicates no known  allergies.  Home Medications   Prior to Admission medications   Medication Sig Start Date End Date Taking? Authorizing Provider  amLODipine (NORVASC) 5 MG tablet PATIENT IS TO TAKE TWO 5 MG TABS TO = 10 MG  DAILY 12/07/14  Yes Ronnald NianJohn C Lalonde, MD  aspirin 81 MG EC tablet Take 81 mg by mouth daily.     Yes Historical Provider, MD  Multiple Vitamins-Minerals (MULTIVITAMIN WITH MINERALS) tablet Take 1 tablet by mouth daily.     Yes Historical Provider, MD  tadalafil (CIALIS) 20 MG tablet Take 1 tablet (20 mg total) by mouth as needed for erectile dysfunction. 12/07/14  Yes Ronnald NianJohn C Lalonde, MD   BP 107/87 mmHg  Pulse 72  Temp(Src) 98.6 F (37 C) (Oral)  Resp 18  Ht 5\' 8"  (1.727 m)  Wt 63.957 kg  BMI 21.44 kg/m2  SpO2 95% Physical Exam  Constitutional: He is oriented to person, place, and time. He appears well-developed and well-nourished.  Well-appearing African-American male  HENT:  Head: Normocephalic and atraumatic.  Mouth/Throat: Oropharynx is clear and moist.  Eyes: Conjunctivae are normal. Pupils are equal, round, and reactive to light. Right eye exhibits no discharge. Left eye exhibits no discharge. No scleral icterus.  Neck: Neck supple.  Cardiovascular: Normal rate, regular rhythm and normal heart sounds.   Pulmonary/Chest: Effort normal and breath sounds normal. No respiratory distress. He has no wheezes. He has no rales.  Abdominal: Soft. There is no tenderness.  Musculoskeletal: He exhibits no tenderness.  Neurological: He is alert and oriented to person, place, and time.  Cranial Nerves II-XII grossly intact  Skin: Skin is warm and dry. No rash noted.  Psychiatric: He has a normal mood and affect.  Nursing note and vitals reviewed.   ED Course  Procedures (including critical care time) Labs Review Labs Reviewed  COMPREHENSIVE METABOLIC PANEL - Abnormal; Notable for the following:    Glucose, Bld 106 (*)    BUN <5 (*)    All other components within normal limits   URINE RAPID DRUG SCREEN, HOSP PERFORMED - Abnormal; Notable for the following:    Cocaine POSITIVE (*)    All other components within normal limits  ACETAMINOPHEN LEVEL - Abnormal; Notable for the following:    Acetaminophen (Tylenol), Serum <10 (*)    All other components within normal limits  LIPASE, BLOOD  CBC WITH DIFFERENTIAL/PLATELET  URINALYSIS, ROUTINE W REFLEX MICROSCOPIC (NOT AT [facility])  SALICYLATE LEVEL    Imaging Review No results found. I have personally reviewed and evaluated these images and lab results as part of my medical decision-making.   EKG Interpretation None     Filed Vitals:   01/07/15 1145 01/07/15 1215 01/07/15 1300 01/07/15 1315  BP: 118/90 108/79 121/84 107/87  Pulse: 88 76 75 72  Temp:      TempSrc:      Resp: Height:      Weight:      SpO2: 95% 94% 98% 95%    MDM  Johndaniel Catlin is a 64 y.o. male with history of substance abuse comes in for evaluation of detox. No evidence of alcohol withdrawal or benzodiazepine use. No suicidal or homicidal ideations. Patient is asked to be medically cleared. Physical exam is unremarkable, vital signs are all stable and within normal limits. Labs are noncontributory. Patient given outpatient resources. Also given return precautions. Patient verbalizes understanding and agrees to this plan. Was no other questions or concerns at this time. Overall, patient appears well, nontoxic and appropriate for discharge. Final diagnoses:  Medical clearance for incarceration  Substance abuse        Joycie Peek, PA-C 01/07/15 1337  Rolan Bucco, MD 01/07/15 1340

## 2015-01-07 NOTE — Discharge Instructions (Signed)
Please follow-up with the resources at the bottom of this resource guide to help with your substance abuse detox. Return to ED for any new or worsening symptoms.  Emergency Department Resource Guide 1) Find a Doctor and Pay Out of Pocket Although you won't have to find out who is covered by your insurance plan, it is a good idea to ask around and get recommendations. You will then need to call the office and see if the doctor you have chosen will accept you as a new patient and what types of options they offer for patients who are self-pay. Some doctors offer discounts or will set up payment plans for their patients who do not have insurance, but you will need to ask so you aren't surprised when you get to your appointment.  2) Contact Your Local Health Department Not all health departments have doctors that can see patients for sick visits, but many do, so it is worth a call to see if yours does. If you don't know where your local health department is, you can check in your phone book. The CDC also has a tool to help you locate your state's health department, and many state websites also have listings of all of their local health departments.  3) Find a Walk-in Clinic If your illness is not likely to be very severe or complicated, you may want to try a walk in clinic. These are popping up all over the country in pharmacies, drugstores, and shopping centers. They're usually staffed by nurse practitioners or physician assistants that have been trained to treat common illnesses and complaints. They're usually fairly quick and inexpensive. However, if you have serious medical issues or chronic medical problems, these are probably not your best option.  No Primary Care Doctor: - Call Health Connect at  [phone] - they can help you locate a primary care doctor that  accepts your insurance, provides certain services, etc. - Physician Referral Service- [phone]  Chronic Pain Problems: Organization          Address  Phone   Notes  [facility]  [phone] Patients need to be referred by their primary care doctor.   Medication Assistance: Organization         Address  Phone   Notes  [facility] Medication [facility] [address] [place], [place] [address] [phone] --Must be a resident of [place] -- Must have NO insurance coverage whatsoever (no Medicaid/ Medicare, etc.) -- The pt. MUST have a primary care doctor that directs their care regularly and follows them in the community   MedAssist  [phone]   [employer]  [phone]    Agencies that provide inexpensive medical care: Organization         Address  Phone   Notes  [facility]  [phone]   [facility]    [phone]   [facility] [address] [place], [place] [address] [phone]   [facility] [address], [place] [phone]   [facility]anned Parenthood    [phone]   [facility]    [phone]   [facility]  [address], [place] Phone:  [phone], Fax:  [phone] Hours of Operation:  9 am - 6 pm, M-F.  Also accepts Medicaid/Medicare and self-pay.  [facility]  [address], [place] Phone: [phone], Fax: [phone]. Hours of Operation:  8:30 am - 5:30 pm, M-F.  Also accepts Medicaid and self-pay.  [facility] [place] [address] [place] Phone: [phone]   [place], [place], [place] [phone], Ext. [phone] Mondays & Thursdays: 7-9 AM.  First 15 patients are seen on a first come, first serve basis.    [place] Providers:  Organization         Address  Phone   Notes  [facility] [address], [place] [phone] Also accepts self-pay patients.    [facility] [id number] [place], [place]  [phone]   [place], [address], [place] [phone]   [facility] [address], [place] [phone]   Lucianne Lei [address], [place]   [phone] Only accepts [place] Access [place] patients after they have their name applied to their card.   Self-Pay (no insurance) in [facility]:  Organization         Address  Phone   Notes  Sickle Cell Patients, [facility] [place] [phone]   [facility] [place] [phone]   [facility] [place]  [place], [place], [place] [phone]   [facility]/Dr. Osei-Bonsu  [address], [address] or [address], [place] [phone] Phone number for both [place] and [place] locations is the same.  [facility] [address] [place] [phone]   [facility] [address] [place] or [address] [phone]   [facility] [address], [place] [phone], phone; [phone], fax Sees patients 1st and 3rd Saturday of every month.  Must not qualify for public or private insurance (i.e. Medicaid, Medicare, [place] Health Choice, Veterans' Benefits)  Household income should be no more than 200% of the poverty level The clinic cannot treat you if you are pregnant or think you are pregnant  Sexually transmitted diseases are not treated at the clinic.    Dental Care: Organization         Address  Phone  Notes  [facility] [place] [phone] Accepts children up to age 60 who are enrolled in [place] or [place]; pregnant women with a Medicaid  card; and children who have applied for Medicaid or [place] Health Choice, but were declined, whose parents can pay a reduced fee at time of service.  [facility] of [facility]  [address], [place] [phone] Accepts children up to age 67 who are enrolled in [place] or [place]; pregnant women with a Medicaid card; and children who have applied for Medicaid or  Health Choice, but were declined, whose parents can pay a reduced fee at time of service.  [place] [facility] Access PROGRAM  [place] [phone] Patients are seen by appointment only. Walk-ins are not accepted. [place] will see patients 10 years of age and older. Monday - Tuesday (8am-5pm) Most Wednesdays (8:30-5pm) $30 per visit, cash only  [place]  [address], [place] [phone] Patients are seen by appointment only. Walk-ins are not accepted. [place] will see patients 75 years of age and older. One Wednesday Evening (Monthly: Volunteer Based).  $30 per visit, cash only  [place]  [phone] for adults; Children under age 37, call Graduate Pediatric Dentistry at [phone]. Children aged 19-14, please call [phone] to request a pediatric application.  Dental services are provided in all areas of dental care including fillings, crowns and bridges, complete and partial dentures, implants, gum treatment, root canals, and extractions. Preventive care is also provided. Treatment is provided to both adults and children. Patients are selected via a lottery and there is often a waiting list.   [facility] [address] [place]  [phone] [email or web address]   [facility] [address] [place], [place] [phone], Ext. [phone] Second and Fourth Thursday of each month, opens at 6:30 AM; Clinic ends at 9 AM.  Patients are seen on a first-come  first-served basis, and a limited number are seen during each clinic.   [facility]  [address] Hillard Danker [place], [place] [phone]   Eligibility Requirements You must have lived in [place], [place], or [place] counties for at least the last three months.   You cannot be eligible for state or federal sponsored [employer], including [employer], [place], or [employer].   You generally cannot be eligible for healthcare insurance through your employer.    How to apply: Eligibility screenings are held every Tuesday and Wednesday afternoon from 1:00 pm until 4:00 pm. You do not need an appointment for the interview!  [facility] [address] [place], [place]   [place] Department  [place]  [phone]    Behavioral Health Resources in the Community: Intensive Outpatient Programs Organization         Address  Phone  Notes  [place] [address], [place], [place] [phone]   [facility] [address], [place], [place]   ADS: Alcohol & Drug Svcs [address] [place], [place]   [place] [address],  [place], [place] or [phone]   Substance Abuse Resources Organization         Address  Phone  Notes  Alcohol and Drug Services  [phone]   [place]  [phone]   [facility] [place]   Chinita Pester  [phone]   Residential & Outpatient Substance Abuse Program  [phone]   Psychological Services Organization         Address  Phone  Notes  [facility] [place]  [place]  [phone]   [place] [address] [place] or [phone]    Mobile Crisis Teams Organization          Address  Phone  Notes  [facility], Mobile Crisis Care Unit  [phone]   [facility]  [address] [place], [place]   [place] [address] [place] [phone]    Self-Help/Support Groups Organization         Address  Phone             Notes  [place] of [place] -  variety of support groups  [phone] Call for more information  Narcotics Anonymous (NA), [facility]aring [facility] [address], [place]  2 meetings at this location   Residential Facilities manager         Address  Phone  Notes  [facility] [place],    [place]  [phone]   [facility]  [address] [place] [id number], [place], [place]   [place], [place] [phone] Admissions: 8am-3pm M-F  Incentives Substance [place] [address]    [place], [place] [phone]   [facility] [address] [place], [place], [place]   [facility] [address]  [place], [place]   [facility] Programs - Intensive Outpatient [address], [place], [place]   [facility] [address].,  [place], [place] [phone] or [phone]   [facility] Treatment Services (RTS) [address], [place], [place] Accepts Medicaid  [facility] [place] [address],  [facility] [place] [phone] Substance Abuse/Addiction Treatment   [facility] Organization         Address  Phone  Notes  [facility]  [phone]   Domenic Schwab, PhD [address] [place], [place]   [phone] or [phone]   [place], [place] [phone]   [facility] [address] [place], [place] [phone] Insurance/Medicaid/sponsorship  through [facility] and Families [address] [place]                                    [place], [place] [phone] [place] [address] [place], [place] [phone]    Dr. Adele Schilder  [phone]   [facility] Dept. 1) [address]  2) [place] 3)  [place] [address], [place] [phone]  [phone]   [place] [phone] or [phone] (After Hours)

## 2015-01-07 NOTE — ED Notes (Signed)
Patient getting urine at this time

## 2015-01-07 NOTE — ED Notes (Signed)
Phlebotomy at the bedside  

## 2015-01-07 NOTE — ED Notes (Signed)
Per Patient, Pt has been on Crack Cocaine for the past five months. He is seeking help to get off of medication and would like medical clearance to go to a facility. Pt denies any other symptoms. Reports his last dose was at 1000 this morning. Pt denies pain, SOB, Nausea, vomiting, or diarrhea.

## 2015-01-12 ENCOUNTER — Ambulatory Visit
Admission: RE | Admit: 2015-01-12 | Discharge: 2015-01-12 | Disposition: A | Discharge status: Home / Self Care | Payer: No Typology Code available for payment source | Source: Ambulatory Visit | Attending: Physician Assistant | Admitting: Physician Assistant

## 2015-01-12 DIAGNOSIS — R634 Abnormal weight loss: Secondary | ICD-10-CM

## 2015-02-10 ENCOUNTER — Telehealth: Payer: Self-pay | Admitting: Gastroenterology

## 2015-02-10 MED ORDER — NA SULFATE-K SULFATE-MG SULF 17.5-3.13-1.6 GM/177ML PO SOLN: 1.0000 | Freq: Once | ORAL | Status: DC

## 2015-02-10 NOTE — Telephone Encounter (Signed)
rx sent as requested for procedure

## 2015-02-12 ENCOUNTER — Ambulatory Visit (AMBULATORY_SURGERY_CENTER): Payer: No Typology Code available for payment source | Admitting: Gastroenterology

## 2015-02-12 ENCOUNTER — Encounter: Payer: Self-pay | Admitting: Gastroenterology

## 2015-02-12 VITALS — BP 124/87 | HR 75 | Temp 96.5°F | Resp 27 | Ht 68.0 in | Wt 141.0 lb

## 2015-02-12 DIAGNOSIS — R634 Abnormal weight loss: Secondary | ICD-10-CM | POA: Diagnosis present

## 2015-02-12 MED ORDER — SODIUM CHLORIDE 0.9 % IV SOLN: 500.0000 mL | INTRAVENOUS | Status: DC

## 2015-02-12 NOTE — Op Note (Signed)
[facility] [address] [place], [phone]   COLONOSCOPY PROCEDURE REPORT  PATIENT: William GreenGalloway, Kaesyn L  MR#: [id number] BIRTHDATE: 30-Jan-1951 , 64  yrs. old GENDER: male ENDOSCOPIST: Rachael Feeaniel P Jacobs, MD REFERRED XB:MWUXBY:John Susann GivensLaLonde, M.D. PROCEDURE DATE:  02/12/2015 PROCEDURE:   Colonoscopy, screening First Screening Colonoscopy - Avg.  risk and is 50 yrs.  old or older - No.  Prior Negative Screening - Now for repeat screening. N/A  History of Adenoma - Now for follow-up colonoscopy & has been > or = to 3 yrs.  N/A  Polyps removed today? No Recommend repeat exam, <10 yrs? No ASA CLASS:   Class II INDICATIONS:weight loss. MEDICATIONS: Monitored anesthesia care and Propofol 350 mg IV  DESCRIPTION OF PROCEDURE:   After the risks benefits and alternatives of the procedure were thoroughly explained, informed consent was obtained.  The digital rectal exam revealed no abnormalities of the rectum.   The LB [id number]  endoscope was introduced through the anus and advanced to the cecum, which was identified by both the appendix and ileocecal valve. No adverse events experienced.   The quality of the prep was excellent.  The instrument was then slowly withdrawn as the colon was fully examined. Estimated blood loss is zero unless otherwise noted in this procedure report.   COLON FINDINGS: A normal appearing cecum, ileocecal valve, and appendiceal orifice were identified.  The ascending, transverse, descending, sigmoid colon, and rectum appeared unremarkable. Retroflexed views revealed no abnormalities. The time to cecum = 3.3 Withdrawal time = 6.9   The scope was withdrawn and the procedure completed. COMPLICATIONS: There were no immediate complications.  ENDOSCOPIC IMPRESSION: Normal colonoscopy No polyps or cancers  RECOMMENDATIONS: You should continue to follow colorectal cancer screening guidelines for "routine risk" patients with a repeat colonoscopy in  10 years.  Return to PCP to consider further workup for weight loss; cocaine use in past several months may contribue.  eSigned:  Rachael Feeaniel P Jacobs, MD 02/12/2015 3:59 PM

## 2015-02-12 NOTE — Progress Notes (Signed)
Patient awakening,vss,report to rn 

## 2015-02-12 NOTE — Patient Instructions (Signed)
Discharge instructions given. Normal exam. Resume previous medications. YOU HAD AN ENDOSCOPIC PROCEDURE TODAY AT THE [facility]:   Refer to the procedure report that was given to you for any specific questions about what was found during the examination.  If the procedure report does not answer your questions, please call your gastroenterologist to clarify.  If you requested that your care partner not be given the details of your procedure findings, then the procedure report has been included in a sealed envelope for you to review at your convenience later.  YOU SHOULD EXPECT: Some feelings of bloating in the abdomen. Passage of more gas than usual.  Walking can help get rid of the air that was put into your GI tract during the procedure and reduce the bloating. If you had a lower endoscopy (such as a colonoscopy or flexible sigmoidoscopy) you may notice spotting of blood in your stool or on the toilet paper. If you underwent a bowel prep for your procedure, you may not have a normal bowel movement for a few days.  Please Note:  You might notice some irritation and congestion in your nose or some drainage.  This is from the oxygen used during your procedure.  There is no need for concern and it should clear up in a day or so.  SYMPTOMS TO REPORT IMMEDIATELY:   Following lower endoscopy (colonoscopy or flexible sigmoidoscopy):  Excessive amounts of blood in the stool  Significant tenderness or worsening of abdominal pains  Swelling of the abdomen that is new, acute  Fever of 100F or higher   For urgent or emergent issues, a gastroenterologist can be reached at any hour by calling [phone].   DIET: Your first meal following the procedure should be a small meal and then it is ok to progress to your normal diet. Heavy or fried foods are harder to digest and may make you feel nauseous or bloated.  Likewise, meals heavy in dairy and vegetables can increase bloating.  Drink plenty  of fluids but you should avoid alcoholic beverages for 24 hours.  ACTIVITY:  You should plan to take it easy for the rest of today and you should NOT DRIVE or use heavy machinery until tomorrow (because of the sedation medicines used during the test).    FOLLOW UP: Our staff will call the number listed on your records the next business day following your procedure to check on you and address any questions or concerns that you may have regarding the information given to you following your procedure. If we do not reach you, we will leave a message.  However, if you are feeling well and you are not experiencing any problems, there is no need to return our call.  We will assume that you have returned to your regular daily activities without incident.  If any biopsies were taken you will be contacted by phone or by letter within the next 1-3 weeks.  Please call us at [phone] if you have not heard about the biopsies in 3 weeks.    SIGNATURES/CONFIDENTIALITY: You and/or your care partner have signed paperwork which will be entered into your electronic medical record.  These signatures attest to the fact that that the information above on your After Visit Summary has been reviewed and is understood.  Full responsibility of the confidentiality of this discharge information lies with you and/or your care-partner. 

## 2015-02-16 ENCOUNTER — Telehealth: Payer: Self-pay

## 2015-02-16 NOTE — Telephone Encounter (Signed)
Left message on answering machine. 

## 2015-11-09 ENCOUNTER — Other Ambulatory Visit: Payer: Self-pay | Admitting: Family Medicine

## 2016-02-15 ENCOUNTER — Telehealth: Payer: Self-pay

## 2016-02-15 MED ORDER — AMLODIPINE BESYLATE 10 MG PO TABS: 10.0000 mg | ORAL_TABLET | Freq: Every day | ORAL | 3 refills | Status: DC

## 2016-02-15 NOTE — Telephone Encounter (Signed)
Request rcvd to change pts amlodipine to 10mg  daily instead of current script of 5mg - 2 tablets daily. (5mg  no longer covered by ins at 2 tablets daily. Trixie Rude/RLB

## 2016-03-07 ENCOUNTER — Telehealth: Payer: Self-pay | Admitting: Internal Medicine

## 2016-03-07 NOTE — Telephone Encounter (Signed)
I [facility] [email or web address] DX Z59.0

## 2016-03-07 NOTE — Telephone Encounter (Signed)
Pt states that he [place] living in [place] right now and he is needing to get a x-ray done. [facility] as he was homless and that is an requirement for him to have an x-ray and he is needing an order.

## 2016-03-07 NOTE — Telephone Encounter (Signed)
See what you continue to help him get that

## 2016-03-29 ENCOUNTER — Encounter: Payer: Self-pay | Admitting: Family Medicine

## 2016-11-20 IMAGING — DX DG CHEST 2V
2 series · 2 of 2 positions shown · non-contrast
Comparison: None.

CLINICAL DATA: Weight loss

EXAM:
CHEST  2 VIEW

[chest pa]
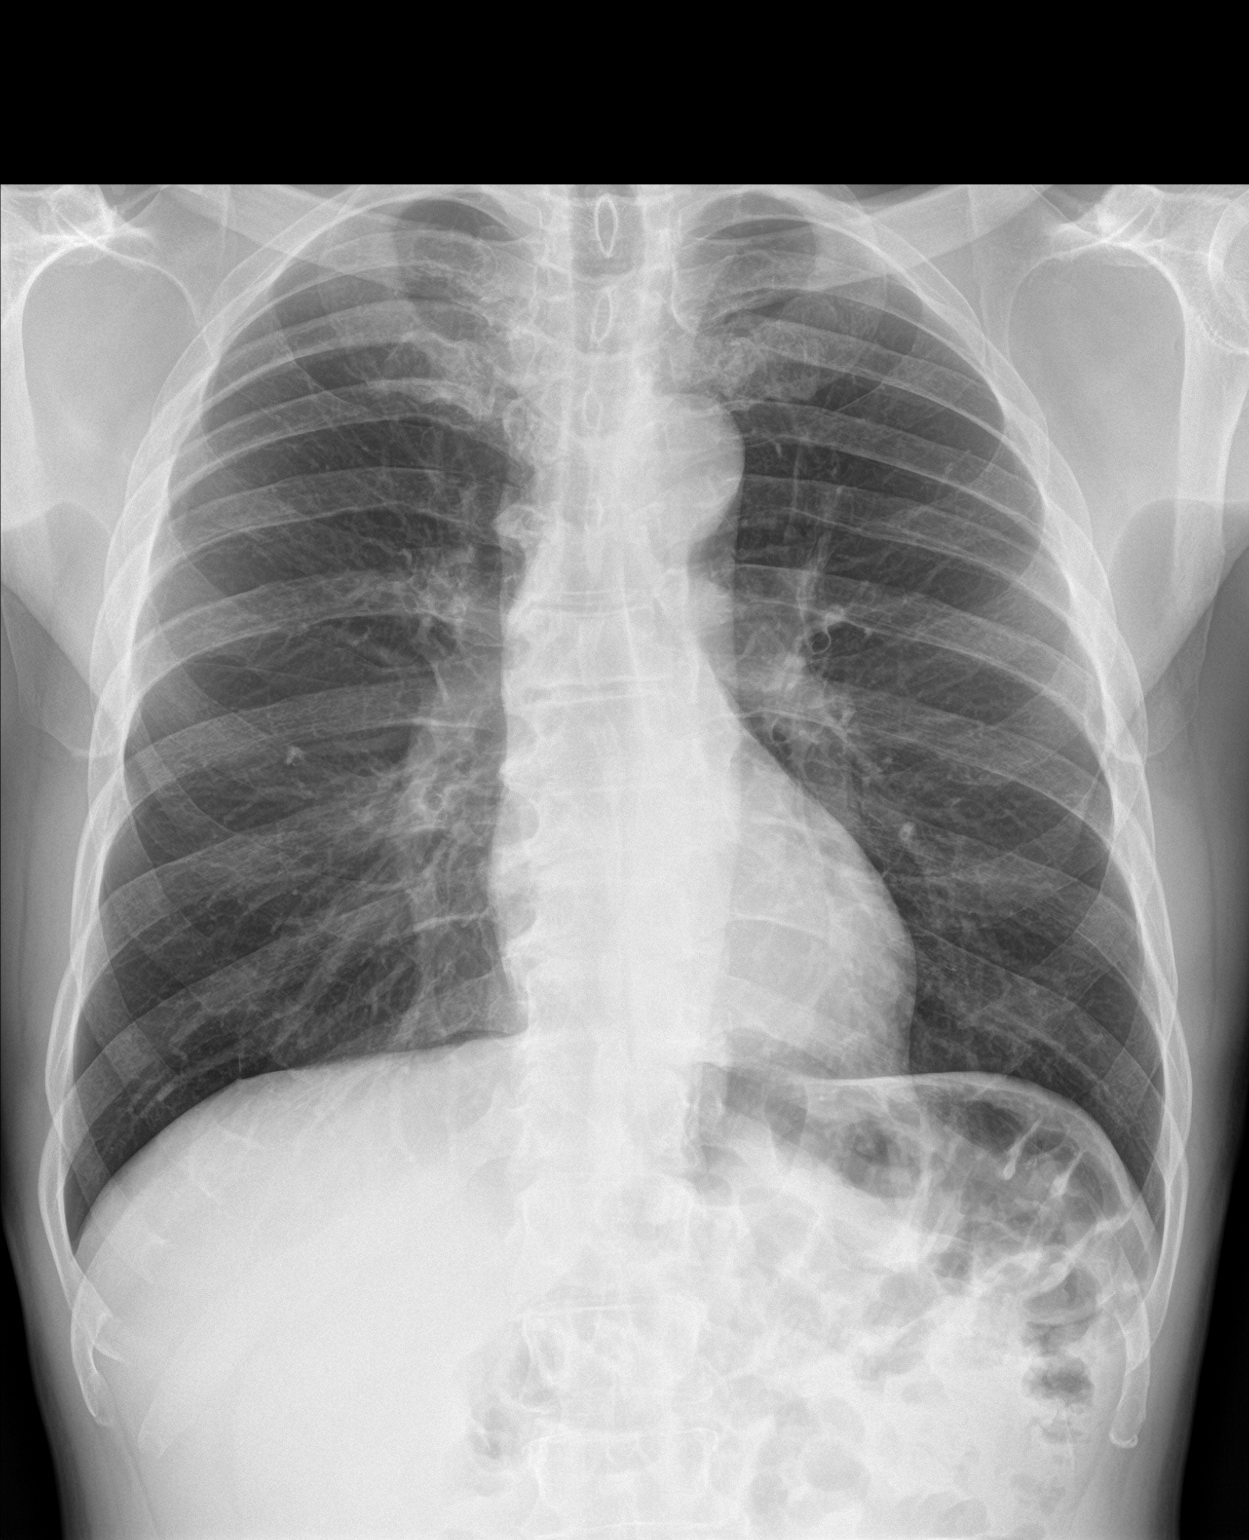

[chest lat]
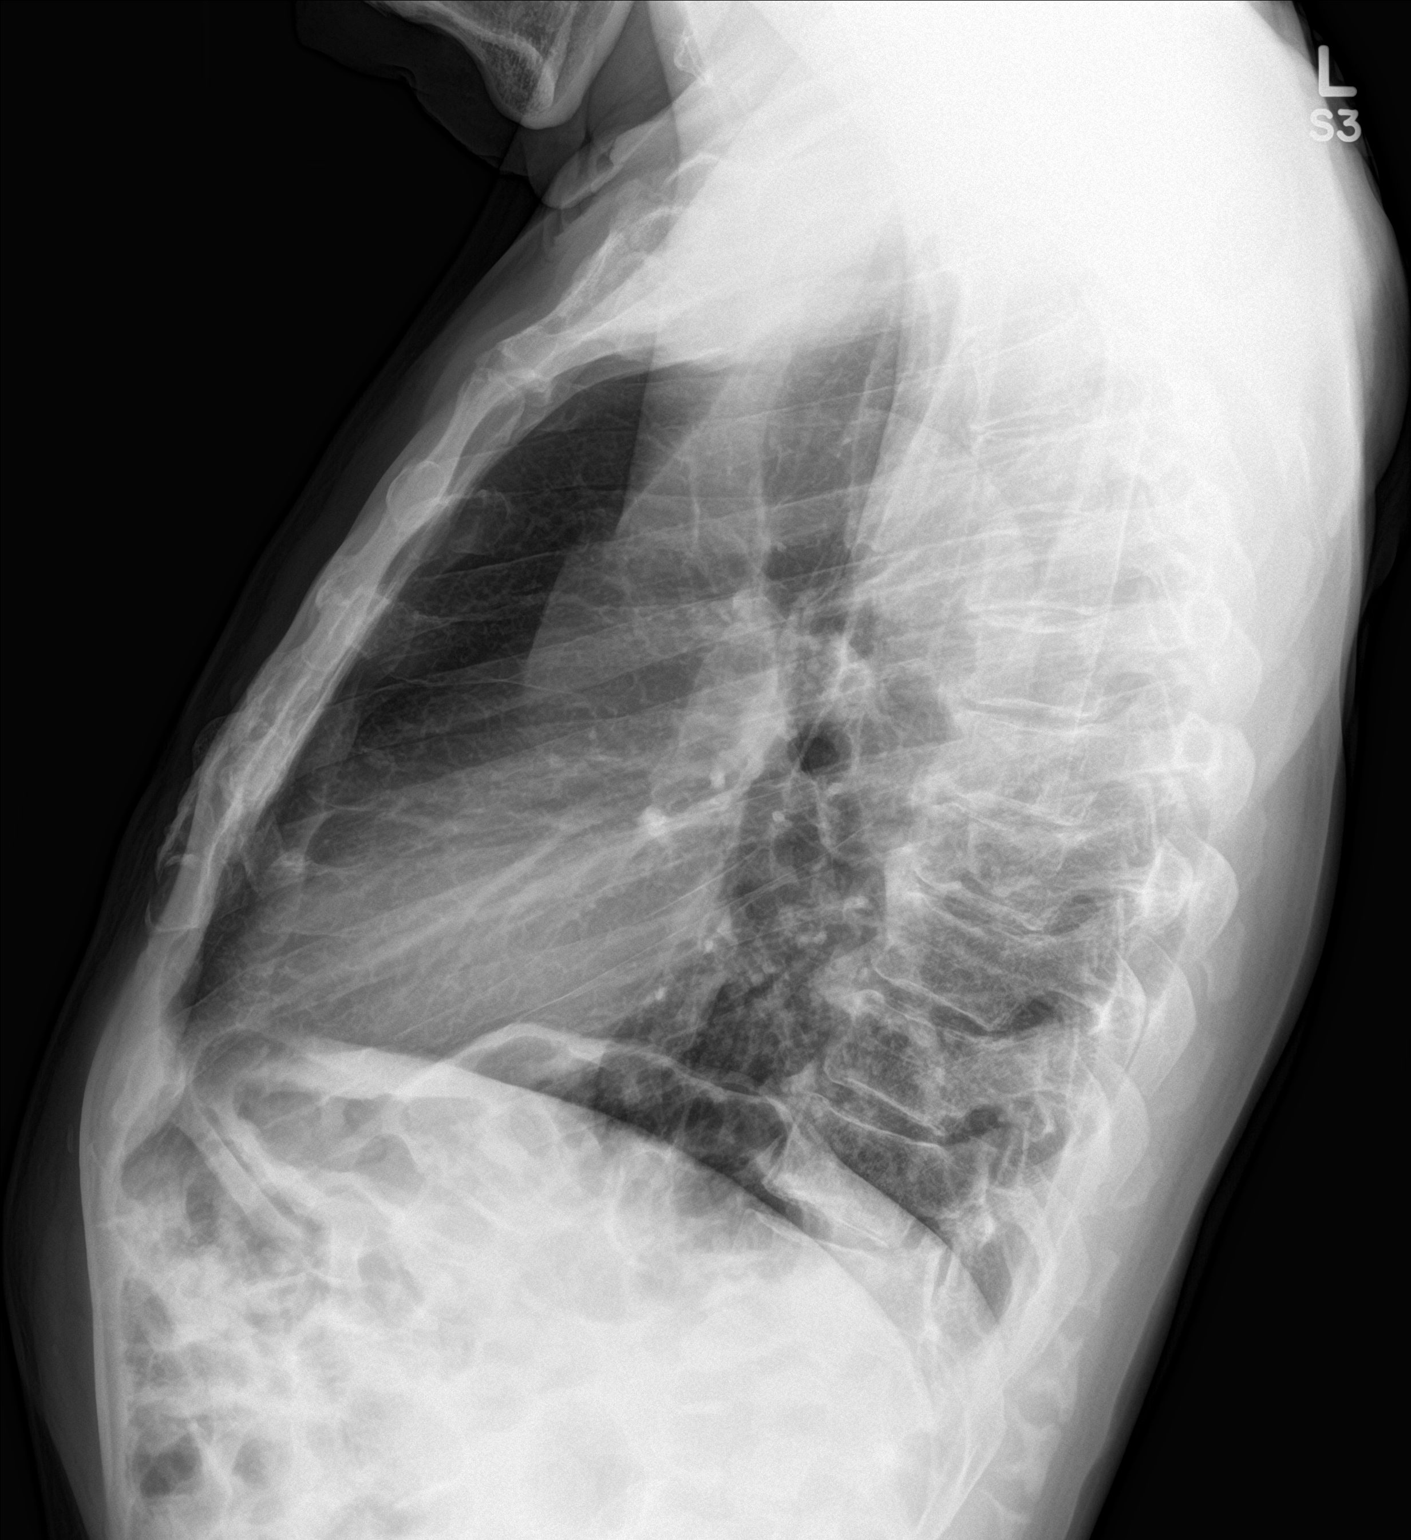

[2 of 2 positions shown; findings below may reference images not displayed]

FINDINGS: Probable calcified granuloma in the right mid lung. Lungs otherwise
clear. Heart is normal size. No effusions. Degenerative changes in
the thoracic spine. No acute bony abnormality.
IMPRESSION: No active cardiopulmonary disease.

## 2017-02-21 ENCOUNTER — Ambulatory Visit: Payer: Self-pay | Admitting: Family Medicine

## 2017-03-10 ENCOUNTER — Other Ambulatory Visit: Payer: Self-pay | Admitting: Family Medicine

## 2017-04-12 ENCOUNTER — Other Ambulatory Visit: Payer: Self-pay | Admitting: Family Medicine

## 2017-05-10 ENCOUNTER — Ambulatory Visit (INDEPENDENT_AMBULATORY_CARE_PROVIDER_SITE_OTHER): Payer: 59 | Admitting: Family Medicine

## 2017-05-10 ENCOUNTER — Encounter: Payer: Self-pay | Admitting: Family Medicine

## 2017-05-10 ENCOUNTER — Other Ambulatory Visit: Payer: Self-pay | Admitting: Family Medicine

## 2017-05-10 VITALS — BP 102/68 | HR 73 | Wt 143.8 lb

## 2017-05-10 DIAGNOSIS — Z13220 Encounter for screening for lipoid disorders: Secondary | ICD-10-CM

## 2017-05-10 DIAGNOSIS — Z1159 Encounter for screening for other viral diseases: Secondary | ICD-10-CM | POA: Diagnosis not present

## 2017-05-10 DIAGNOSIS — I10 Essential (primary) hypertension: Secondary | ICD-10-CM

## 2017-05-10 DIAGNOSIS — Z23 Encounter for immunization: Secondary | ICD-10-CM | POA: Diagnosis not present

## 2017-05-10 DIAGNOSIS — Z136 Encounter for screening for cardiovascular disorders: Secondary | ICD-10-CM

## 2017-05-10 DIAGNOSIS — F17200 Nicotine dependence, unspecified, uncomplicated: Secondary | ICD-10-CM | POA: Diagnosis not present

## 2017-05-10 DIAGNOSIS — M1990 Unspecified osteoarthritis, unspecified site: Secondary | ICD-10-CM

## 2017-05-10 DIAGNOSIS — N5201 Erectile dysfunction due to arterial insufficiency: Secondary | ICD-10-CM | POA: Diagnosis not present

## 2017-05-10 MED ORDER — AMLODIPINE BESYLATE 10 MG PO TABS: 10.0000 mg | ORAL_TABLET | Freq: Every day | ORAL | 0 refills | Status: DC

## 2017-05-10 MED ORDER — TADALAFIL 20 MG PO TABS: 20.0000 mg | ORAL_TABLET | ORAL | 1 refills | Status: AC | PRN

## 2017-05-10 NOTE — Patient Instructions (Signed)
Use Tylenol first for your arthritis and then go with Advil or Aleve if you need to

## 2017-05-10 NOTE — Progress Notes (Signed)
   Subjective:    Patient ID: William Blackwell, male    DOB: 13-May-1950, 67 y.o.   MRN: [id number]  HPI He is here for a med check appointment.  He does complain of soreness in his hands and has noted this especially after he retired.  His retirement is going well but he is considering getting a part-time job.  He continues to smoke less than a half a pack per day.  Continues on his amlodipine and is having no difficulty with that.  He is not presently sexually active but would like to have Cialis around just in case.  Otherwise he has no particular concerns or complaints.  Health maintenance and immunizations as well as family and social history was reviewed.   Review of Systems     Objective:   Physical Exam Alert and in no distress. Tympanic membranes and canals are normal. Pharyngeal area is normal. Neck is supple without adenopathy or thyromegaly. Cardiac exam shows a regular sinus rhythm without murmurs or gallops. Lungs are clear to auscultation. Abdominal exam shows no hepatosplenomegaly, masses or tenderness.       Assessment & Plan:  Essential hypertension - Plan: CBC with Differential/Platelet, Comprehensive metabolic panel, amLODipine (NORVASC) 10 MG tablet  Erectile dysfunction due to arterial insufficiency - Plan: tadalafil (CIALIS) 20 MG tablet  Need for Tdap vaccination - Plan: Tdap vaccine greater than or equal to 7yo IM  Need for vaccination against Streptococcus pneumoniae - Plan: Pneumococcal conjugate vaccine 13-valent  Need for shingles vaccine - Plan: Varicella-zoster vaccine IM  Screening for AAA (abdominal aortic aneurysm) - Plan: US ABDOMINAL AORTA SCREENING AAA  Need for hepatitis C screening test - Plan: Hepatitis C antibody  Screening for lipid disorders - Plan: Lipid panel His immunizations as well as health maintenance was taking care of.  I will give him Cialis.  Discussed the use of other ED medications based on their cost.  Discussed his  retirement with him in regard to psychological, Social and intellectual issues.  Also encouraged him to stay physically active and potentially joint Silver Sneakers.  Also discussed advanced directive. Then discussed arthritis with him.  Explained that this is a natural process and initially we would recommend Tylenol and then proceed onto the NSAIDs as needed.  Discussed smoking cessation with him and encouraged him to quit whenever he is ready.  He understands this.

## 2017-05-11 LAB — LIPID PANEL
CHOLESTEROL TOTAL: 171 mg/dL (ref 100–199)
Chol/HDL Ratio: 3.1 ratio (ref 0.0–5.0)
HDL: 55 mg/dL (ref >39)
LDL Calculated: 104 mg/dL — ABNORMAL HIGH (ref 0–99)
Triglycerides: 58 mg/dL (ref 0–149)
VLDL Cholesterol Cal: 12 mg/dL (ref 5–40)

## 2017-05-11 LAB — CBC WITH DIFFERENTIAL/PLATELET
BASOS ABS: 0 10*3/uL (ref 0.0–0.2)
Basos: 0 %
EOS (ABSOLUTE): 0.1 10*3/uL (ref 0.0–0.4)
Eos: 2 %
HEMOGLOBIN: 15 g/dL (ref 13.0–17.7)
Hematocrit: 44.9 % (ref 37.5–51.0)
Immature Grans (Abs): 0 10*3/uL (ref 0.0–0.1)
Immature Granulocytes: 0 %
LYMPHS ABS: 1.5 10*3/uL (ref 0.7–3.1)
Lymphs: 46 %
MCH: 28.4 pg (ref 26.6–33.0)
MCHC: 33.4 g/dL (ref 31.5–35.7)
MCV: 85 fL (ref 79–97)
MONOCYTES: 9 %
Monocytes Absolute: 0.3 10*3/uL (ref 0.1–0.9)
NEUTROS ABS: 1.4 10*3/uL (ref 1.4–7.0)
Neutrophils: 43 %
Platelets: 245 10*3/uL (ref 150–379)
RBC: 5.29 x10E6/uL (ref 4.14–5.80)
RDW: 15 % (ref 12.3–15.4)
WBC: 3.3 10*3/uL — ABNORMAL LOW (ref 3.4–10.8)

## 2017-05-11 LAB — COMPREHENSIVE METABOLIC PANEL
A/G RATIO: 1.6 (ref 1.2–2.2)
ALBUMIN: 4.5 g/dL (ref 3.6–4.8)
ALK PHOS: 63 IU/L (ref 39–117)
ALT: 15 IU/L (ref 0–44)
AST: 20 IU/L (ref 0–40)
BILIRUBIN TOTAL: 0.5 mg/dL (ref 0.0–1.2)
BUN / CREAT RATIO: 10 (ref 10–24)
BUN: 9 mg/dL (ref 8–27)
CHLORIDE: 102 mmol/L (ref 96–106)
CO2: 26 mmol/L (ref 20–29)
Calcium: 9.7 mg/dL (ref 8.6–10.2)
Creatinine, Ser: 0.94 mg/dL (ref 0.76–1.27)
GFR calc Af Amer: 97 mL/min/{1.73_m2} (ref >59)
GFR calc non Af Amer: 84 mL/min/{1.73_m2} (ref >59)
GLOBULIN, TOTAL: 2.8 g/dL (ref 1.5–4.5)
GLUCOSE: 90 mg/dL (ref 65–99)
POTASSIUM: 4.3 mmol/L (ref 3.5–5.2)
SODIUM: 141 mmol/L (ref 134–144)
Total Protein: 7.3 g/dL (ref 6.0–8.5)

## 2017-05-11 LAB — HEPATITIS C ANTIBODY: HEP C VIRUS AB: 0.1 {s_co_ratio} (ref 0.0–0.9)

## 2017-05-16 ENCOUNTER — Ambulatory Visit (HOSPITAL_COMMUNITY): Payer: No Typology Code available for payment source

## 2017-05-23 ENCOUNTER — Ambulatory Visit (HOSPITAL_COMMUNITY): Payer: PRIVATE HEALTH INSURANCE

## 2017-07-11 ENCOUNTER — Other Ambulatory Visit: Payer: Self-pay

## 2017-12-07 ENCOUNTER — Other Ambulatory Visit: Payer: Self-pay | Admitting: Family Medicine

## 2017-12-07 DIAGNOSIS — I10 Essential (primary) hypertension: Secondary | ICD-10-CM

## 2018-04-02 ENCOUNTER — Telehealth: Payer: Self-pay | Admitting: Internal Medicine

## 2018-04-02 NOTE — Telephone Encounter (Signed)
Due for 2nd dose, left message to call back to schedule

## 2018-04-05 ENCOUNTER — Telehealth: Payer: Self-pay

## 2018-04-05 NOTE — Telephone Encounter (Signed)
Called pt to advise of 2nd shingle shot needed. KH

## 2018-05-07 ENCOUNTER — Encounter: Payer: Self-pay | Admitting: Family Medicine

## 2018-06-06 ENCOUNTER — Telehealth: Payer: Self-pay | Admitting: Family Medicine

## 2018-06-06 NOTE — Telephone Encounter (Signed)
Pt called and and requested a 90 day refill of amlodipine. Pt was told he needed an appt and I would send a refill request back but it would probably be only filled for 30 because it has been a year since he had an appt. Pt states he is not going to make an appt and hung up on me.  [facility] [address]

## 2018-06-06 NOTE — Telephone Encounter (Signed)
No need to refill it if he is not going to make an appointment

## 2018-06-07 NOTE — Telephone Encounter (Signed)
Pt was advised to make a virtual visit for HTN. Pt will call back to schedule

## 2018-06-07 NOTE — Telephone Encounter (Signed)
Forward to sabrina

## 2018-06-11 ENCOUNTER — Other Ambulatory Visit: Payer: Self-pay | Admitting: Family Medicine

## 2018-06-11 DIAGNOSIS — I10 Essential (primary) hypertension: Secondary | ICD-10-CM

## 2018-07-06 ENCOUNTER — Other Ambulatory Visit: Payer: Self-pay | Admitting: Family Medicine

## 2018-07-06 DIAGNOSIS — I10 Essential (primary) hypertension: Secondary | ICD-10-CM

## 2019-04-13 ENCOUNTER — Ambulatory Visit: Payer: PRIVATE HEALTH INSURANCE | Attending: Internal Medicine

## 2019-04-13 ENCOUNTER — Other Ambulatory Visit: Payer: Self-pay

## 2019-04-13 DIAGNOSIS — Z23 Encounter for immunization: Secondary | ICD-10-CM | POA: Insufficient documentation

## 2019-04-13 NOTE — Progress Notes (Signed)
   Covid-19 Vaccination Clinic  Name:  William Blackwell    MRN: [id number] DOB: 28-Dec-1950  04/13/2019  Mr. William Blackwell was observed post Covid-19 immunization for 15 minutes without incidence. He was provided with Vaccine Information Sheet and instruction to access the V-Safe system.   Mr. William Blackwell was instructed to call 911 with any severe reactions post vaccine: Marland Kitchen Difficulty breathing  . Swelling of your face and throat  . A fast heartbeat  . A bad rash all over your body  . Dizziness and weakness    Immunizations Administered    Name Date Dose VIS Date Route   Pfizer COVID-19 Vaccine 04/13/2019 10:47 AM 0.3 mL 01/24/2019 Intramuscular   Manufacturer: [employer], [employer]   Lot: [id number]   NDC: [address]-6

## 2019-05-13 ENCOUNTER — Ambulatory Visit: Payer: PRIVATE HEALTH INSURANCE | Attending: Internal Medicine

## 2019-05-13 DIAGNOSIS — Z23 Encounter for immunization: Secondary | ICD-10-CM

## 2019-05-13 NOTE — Progress Notes (Signed)
   Covid-19 Vaccination Clinic  Name:  William Blackwell    MRN: [id number] DOB: 1950/08/22  05/13/2019  [name] was observed post Covid-19 immunization for 15 minutes without incident. He was provided with Vaccine Information Sheet and instruction to access the V-Safe system.   [name] was instructed to call 911 with any severe reactions post vaccine: Marland Kitchen Difficulty breathing  . Swelling of face and throat  . A fast heartbeat  . A bad rash all over body  . Dizziness and weakness   Immunizations Administered    Name Date Dose VIS Date Route   Pfizer COVID-19 Vaccine 05/13/2019  8:18 AM 0.3 mL 01/24/2019 Intramuscular   Manufacturer: [employer], [employer]   Lot: [id number]   NDC: [address]-5

## 2019-10-14 ENCOUNTER — Encounter: Payer: Self-pay | Admitting: Medical

## 2019-10-14 ENCOUNTER — Ambulatory Visit (INDEPENDENT_AMBULATORY_CARE_PROVIDER_SITE_OTHER): Payer: 59 | Admitting: Medical

## 2019-10-14 ENCOUNTER — Other Ambulatory Visit: Payer: Self-pay

## 2019-10-14 ENCOUNTER — Ambulatory Visit
Admission: RE | Admit: 2019-10-14 | Discharge: 2019-10-14 | Disposition: A | Discharge status: Home / Self Care | Payer: PRIVATE HEALTH INSURANCE | Source: Ambulatory Visit | Attending: Medical | Admitting: Medical

## 2019-10-14 VITALS — BP 156/90 | HR 72 | Ht 68.0 in | Wt 176.6 lb

## 2019-10-14 DIAGNOSIS — F17200 Nicotine dependence, unspecified, uncomplicated: Secondary | ICD-10-CM

## 2019-10-14 DIAGNOSIS — R635 Abnormal weight gain: Secondary | ICD-10-CM

## 2019-10-14 DIAGNOSIS — F1910 Other psychoactive substance abuse, uncomplicated: Secondary | ICD-10-CM | POA: Diagnosis not present

## 2019-10-14 DIAGNOSIS — I10 Essential (primary) hypertension: Secondary | ICD-10-CM

## 2019-10-14 DIAGNOSIS — R609 Edema, unspecified: Secondary | ICD-10-CM | POA: Insufficient documentation

## 2019-10-14 LAB — CBC WITH DIFFERENTIAL/PLATELET
Basophils Absolute: 0 10*3/uL (ref 0.0–0.2)
Basos: 0 %
EOS (ABSOLUTE): 0.2 10*3/uL (ref 0.0–0.4)
Eos: 2 %
Hematocrit: 36.8 % — ABNORMAL LOW (ref 37.5–51.0)
Hemoglobin: 12.6 g/dL — ABNORMAL LOW (ref 13.0–17.7)
Lymphocytes Absolute: 1.7 10*3/uL (ref 0.7–3.1)
Lymphs: 27 %
MCH: 29.6 pg (ref 26.6–33.0)
MCHC: 34.2 g/dL (ref 31.5–35.7)
MCV: 86 fL (ref 79–97)
Monocytes Absolute: 0.6 10*3/uL (ref 0.1–0.9)
Monocytes: 10 %
Neutrophils Absolute: 3.8 10*3/uL (ref 1.4–7.0)
Neutrophils: 61 %
Platelets: 271 10*3/uL (ref 150–450)
RBC: 4.26 x10E6/uL (ref 4.14–5.80)
RDW: 12.9 % (ref 11.6–15.4)
WBC: 6.2 10*3/uL (ref 3.4–10.8)

## 2019-10-14 LAB — COMPREHENSIVE METABOLIC PANEL
ALT: 24 IU/L (ref 0–44)
AST: 30 IU/L (ref 0–40)
Albumin/Globulin Ratio: 1.7 (ref 1.2–2.2)
Albumin: 4.3 g/dL (ref 3.8–4.8)
Alkaline Phosphatase: 61 IU/L (ref 48–121)
BUN/Creatinine Ratio: 9 — ABNORMAL LOW (ref 10–24)
BUN: 7 mg/dL — ABNORMAL LOW (ref 8–27)
Bilirubin Total: 0.3 mg/dL (ref 0.0–1.2)
CO2: 30 mmol/L — ABNORMAL HIGH (ref 20–29)
Calcium: 9.7 mg/dL (ref 8.6–10.2)
Chloride: 103 mmol/L (ref 96–106)
Creatinine, Ser: 0.79 mg/dL (ref 0.76–1.27)
GFR calc Af Amer: 106 mL/min/{1.73_m2} (ref >59)
GFR calc non Af Amer: 92 mL/min/{1.73_m2} (ref >59)
Globulin, Total: 2.5 g/dL (ref 1.5–4.5)
Glucose: 102 mg/dL — ABNORMAL HIGH (ref 65–99)
Potassium: 4.1 mmol/L (ref 3.5–5.2)
Sodium: 140 mmol/L (ref 134–144)
Total Protein: 6.8 g/dL (ref 6.0–8.5)

## 2019-10-14 MED ORDER — AMLODIPINE BESYLATE 10 MG PO TABS: 10.0000 mg | ORAL_TABLET | Freq: Every day | ORAL | 3 refills | Status: AC

## 2019-10-14 MED ORDER — POTASSIUM CHLORIDE CRYS ER 10 MEQ PO TBCR: 10.0000 meq | EXTENDED_RELEASE_TABLET | Freq: Every day | ORAL | 1 refills | Status: DC

## 2019-10-14 MED ORDER — FUROSEMIDE 20 MG PO TABS: 20.0000 mg | ORAL_TABLET | Freq: Two times a day (BID) | ORAL | 0 refills | Status: AC

## 2019-10-14 NOTE — Addendum Note (Signed)
Addended by: Jac Canavan on: 10/14/2019 02:43 PM   Modules accepted: Orders

## 2019-10-14 NOTE — Patient Instructions (Signed)
Please go to [facility] for your chest xray.   Their hours are 8am - 4:30 pm Monday - Friday.  Take your insurance card with you. ° °[facility] °[phone] ° °[address], [address] °[place], [place] [address] ° °[address] °[place], [place] [address] ° ° °

## 2019-10-14 NOTE — Progress Notes (Addendum)
Subjective:  William Blackwell is a 69 y.o. male who presents for Chief Complaint  Patient presents with  . Leg Swelling    bilateral   . Foot Swelling    bilateral      Here for acute concerns of swelling.  He normally sees William Blackwell here for primary care.  He did not come in at all in 2020.  There may have been simply miscommunication.  He notes that he was told he had to come in in person.  He did not have the ability to do a virtual call by video but says he would have gladly done a telephone encounter.  Not sure the confusion but either way he has not taken his blood pressure pill in the last year  He has been feeling okay until about 2 weeks ago.  In the last 2 weeks he has gained fluid weight in his legs, his legs are tense and swollen.  Otherwise feels okay.  No chest pain no palpitations: No shortness of breath.  He has not had this symptom like this before.  He does smoke half a pack per day, drinks beer some but not daily, uses some marijuana.  He has actually used some cocaine in the last year  No other aggravating or relieving factors.    No other c/o.  Past Medical History:  Diagnosis Date  . Dyslipidemia   . Hypertension   . PPD positive, treated    Current Outpatient Medications on File Prior to Visit  Medication Sig Dispense Refill  . aspirin 81 MG EC tablet Take 81 mg by mouth daily.   (Patient not taking: Reported on 10/14/2019)    . Multiple Vitamins-Minerals (MULTIVITAMIN WITH MINERALS) tablet Take 1 tablet by mouth daily.   (Patient not taking: Reported on 10/14/2019)    . tadalafil (CIALIS) 20 MG tablet Take 1 tablet (20 mg total) by mouth as needed for erectile dysfunction. (Patient not taking: Reported on 10/14/2019) 5 tablet 1   No current facility-administered medications on file prior to visit.   Family History  Problem Relation Age of Onset  . Ovarian cancer Mother   . Hypertension Mother   . Lung cancer Mother        mets from ovaries  .  Cancer Father        abdominal??  . Breast cancer Maternal Grandmother   . Diabetes Paternal Aunt   . Diabetes Cousin      The following portions of the patient's history were reviewed and updated as appropriate: allergies, current medications, past family history, past medical history, past social history, past surgical history and problem list.  ROS Otherwise as in subjective above  Objective: BP (!) 156/90   Pulse 72   Ht 5\' 8"  (1.727 m)   Wt 176 lb 9.6 oz (80.1 kg)   SpO2 97%   BMI 26.85 kg/m   BP Readings from Last 3 Encounters:  10/14/19 (!) 156/90  05/10/17 102/68  02/12/15 124/87   Wt Readings from Last 3 Encounters:  10/14/19 176 lb 9.6 oz (80.1 kg)  05/10/17 143 lb 12.8 oz (65.2 kg)  02/12/15 141 lb (64 kg)    General appearance: alert, no distress, well developed, well nourished, African-American Oral cavity: MMM, no lesions Neck: supple, no lymphadenopathy, no thyromegaly, no masses, no JVD or bruits Heart: RRR, normal S1, S2, no murmurs Lungs: Possible faint lower field crackles, otherwise no wheezes Abdomen: +bs, soft, non tender, non distended, no masses, no hepatomegaly,  no splenomegaly Pulses: 2+ radial pulses, 1+ pedal pulses, normal cap refill Ext: Diffuse tense 2+ pitting edema bilateral lower legs, moderate varicose veins also noted, no palpable cord, no calf specific tenderness but just tender in general from the tension of his leg swelling   Assessment: Encounter Diagnoses  Name Primary?  . Edema, unspecified type Yes  . Essential hypertension   . Smoker   . Substance abuse (HCC)   . Weight gain      Plan: I discussed his findings today including abnormal EKG, no prior EKG to compare, diffuse leg swelling, possible causes which could include CHF, kidney failure, liver disease or other.  We will check some stat labs.  He will go for chest x-ray.  We will likely diurese with Lasix, leg elevation, and have short follow-up.  Discussed  importance of compliance with treatment.  I expressed apologies for any miscommunication last year.  He should have been seen at some point last year but there apparently was some miscommunication as he did not think he could do a telephone appointment only and had no way of coming in or doing a virtual consult  William Blackwell was seen today for leg swelling and foot swelling.  Diagnoses and all orders for this visit:  Edema, unspecified type -     EKG 12-Lead -     DG Chest 2 View; Future -     Comprehensive metabolic panel -     CBC with Differential/Platelet -     Brain natriuretic peptide  Essential hypertension -     EKG 12-Lead -     DG Chest 2 View; Future -     Comprehensive metabolic panel -     CBC with Differential/Platelet -     amLODipine (NORVASC) 10 MG tablet; Take 1 tablet (10 mg total) by mouth daily.  Smoker -     EKG 12-Lead -     DG Chest 2 View; Future -     Comprehensive metabolic panel -     CBC with Differential/Platelet  Substance abuse (HCC) -     EKG 12-Lead -     DG Chest 2 View; Future -     Comprehensive metabolic panel -     CBC with Differential/Platelet -     Brain natriuretic peptide  Weight gain -     EKG 12-Lead -     DG Chest 2 View; Future -     Comprehensive metabolic panel -     CBC with Differential/Platelet -     Brain natriuretic peptide  Other orders -     furosemide (LASIX) 20 MG tablet; Take 1 tablet (20 mg total) by mouth 2 (two) times daily. -     potassium chloride (KLOR-CON) 10 MEQ tablet; Take 1 tablet (10 mEq total) by mouth daily.    Follow up: pending labs

## 2019-10-15 LAB — BRAIN NATRIURETIC PEPTIDE: BNP: 41.1 pg/mL (ref 0.0–100.0)

## 2019-10-16 ENCOUNTER — Encounter: Payer: Self-pay | Admitting: Family Medicine

## 2019-10-21 ENCOUNTER — Ambulatory Visit (INDEPENDENT_AMBULATORY_CARE_PROVIDER_SITE_OTHER): Payer: 59 | Admitting: Family Medicine

## 2019-10-21 ENCOUNTER — Ambulatory Visit (HOSPITAL_COMMUNITY)
Admission: RE | Admit: 2019-10-21 | Discharge: 2019-10-21 | Disposition: A | Discharge status: Home / Self Care | Payer: 59 | Source: Ambulatory Visit | Attending: Family Medicine | Admitting: Family Medicine

## 2019-10-21 ENCOUNTER — Encounter: Payer: Self-pay | Admitting: Family Medicine

## 2019-10-21 ENCOUNTER — Other Ambulatory Visit: Payer: Self-pay

## 2019-10-21 VITALS — BP 132/78 | HR 80 | Temp 98.0°F | Wt 175.2 lb

## 2019-10-21 DIAGNOSIS — R609 Edema, unspecified: Secondary | ICD-10-CM

## 2019-10-21 DIAGNOSIS — R599 Enlarged lymph nodes, unspecified: Secondary | ICD-10-CM

## 2019-10-21 NOTE — Progress Notes (Signed)
   Subjective:    Patient ID: William Blackwell, male    DOB: [birth year], 69 y.o.   MRN: [id number]  HPI He is here for evaluation of continued difficulty with bilateral leg swelling that has been going on for the last 2 weeks.  He was seen recently by Kristian Covey and that record was reviewed.  The blood work, EKG and exam was reviewed.  He was given Lasix but he states it has had no effect on him.  He again is having no difficulty with chest pain, shortness of breath, PND or DOE.   Review of Systems     Objective:   Physical Exam Alert and in no distress.  Cardiac exam shows a regular sinus rhythm without murmurs or gallops.  Lungs are clear to auscultation.  Lower extremity exam does show 3+ pitting edema that goes at least halfway up the thighs.  Pulses were difficult to feel but he did have capillary refill.  Femoral artery pulse was detectable.       Assessment & Plan:  Edema, unspecified type - Plan: VAS [place] LOWER EXTREMITY VENOUS (DVT) I will start with Doppler studies to see if we can get a idea behind the bilateral edema.  May end up doing further testing including scan of abdomen and pelvis

## 2019-10-22 ENCOUNTER — Ambulatory Visit: Payer: Medicare Other | Admitting: Family Medicine

## 2019-10-22 ENCOUNTER — Other Ambulatory Visit: Payer: Self-pay | Admitting: Family Medicine

## 2019-10-23 ENCOUNTER — Encounter (HOSPITAL_COMMUNITY): Payer: Self-pay

## 2019-10-23 ENCOUNTER — Other Ambulatory Visit: Payer: Self-pay

## 2019-10-23 ENCOUNTER — Ambulatory Visit (HOSPITAL_COMMUNITY)
Admission: RE | Admit: 2019-10-23 | Discharge: 2019-10-23 | Disposition: A | Discharge status: Home / Self Care | Payer: 59 | Source: Ambulatory Visit | Attending: Family Medicine | Admitting: Family Medicine

## 2019-10-23 ENCOUNTER — Other Ambulatory Visit: Payer: Self-pay | Admitting: Family Medicine

## 2019-10-23 DIAGNOSIS — R609 Edema, unspecified: Secondary | ICD-10-CM

## 2019-10-23 DIAGNOSIS — R599 Enlarged lymph nodes, unspecified: Secondary | ICD-10-CM | POA: Diagnosis present

## 2019-10-23 MED ORDER — IOHEXOL 300 MG/ML  SOLN
100.0000 mL | Freq: Once | INTRAMUSCULAR | Status: AC | PRN
  Administered 2019-10-23: 100 mL via INTRAVENOUS

## 2019-10-24 ENCOUNTER — Ambulatory Visit: Payer: 59 | Admitting: Family Medicine

## 2019-10-27 ENCOUNTER — Ambulatory Visit (INDEPENDENT_AMBULATORY_CARE_PROVIDER_SITE_OTHER): Payer: 59 | Admitting: Family Medicine

## 2019-10-27 ENCOUNTER — Encounter: Payer: Self-pay | Admitting: Family Medicine

## 2019-10-27 ENCOUNTER — Other Ambulatory Visit: Payer: Self-pay

## 2019-10-27 VITALS — BP 120/76 | Temp 97.6°F | Wt 170.6 lb

## 2019-10-27 DIAGNOSIS — R600 Localized edema: Secondary | ICD-10-CM | POA: Diagnosis not present

## 2019-10-27 DIAGNOSIS — R609 Edema, unspecified: Secondary | ICD-10-CM | POA: Diagnosis not present

## 2019-10-27 DIAGNOSIS — R599 Enlarged lymph nodes, unspecified: Secondary | ICD-10-CM

## 2019-10-27 NOTE — Progress Notes (Signed)
   Subjective:    Patient ID: William Blackwell, male    DOB: Aug 29, 1950, 69 y.o.   MRN: [id number]  HPI He is here for a recheck on bilateral leg swelling.  He has had blood work including BMP which was negative.  Venous Dopplers and CT abdomen and pelvis was nondiagnostic.  He has been using Lasix without much success.  No chest pain, shortness of breath, PND or DOE.   Review of Systems     Objective:   Physical Exam Alert and in no distress.  Cardiac exam shows regular rhythm without murmurs or gallops.  Lungs are clear to auscultation.  Lower extremities do show pitting edema with slight warmth and tender with deep palpation.       Assessment & Plan:  Edema, unspecified type - Plan: CBC with Differential/Platelet, Comprehensive metabolic panel, Brain natriuretic peptide  Lymph node enlargement - Plan: CBC with Differential/Platelet, Comprehensive metabolic panel, Brain natriuretic peptide  Localized edema  - Plan: Brain natriuretic peptide

## 2019-10-28 LAB — CBC WITH DIFFERENTIAL/PLATELET
Basophils Absolute: 0 10*3/uL (ref 0.0–0.2)
Basos: 0 %
EOS (ABSOLUTE): 0.2 10*3/uL (ref 0.0–0.4)
Eos: 3 %
Hematocrit: 38.8 % (ref 37.5–51.0)
Hemoglobin: 13.1 g/dL (ref 13.0–17.7)
Immature Grans (Abs): 0 10*3/uL (ref 0.0–0.1)
Immature Granulocytes: 0 %
Lymphocytes Absolute: 1.3 10*3/uL (ref 0.7–3.1)
Lymphs: 24 %
MCH: 29.7 pg (ref 26.6–33.0)
MCHC: 33.8 g/dL (ref 31.5–35.7)
MCV: 88 fL (ref 79–97)
Monocytes Absolute: 0.6 10*3/uL (ref 0.1–0.9)
Monocytes: 10 %
Neutrophils Absolute: 3.3 10*3/uL (ref 1.4–7.0)
Neutrophils: 63 %
Platelets: 293 10*3/uL (ref 150–450)
RBC: 4.41 x10E6/uL (ref 4.14–5.80)
RDW: 11.6 % (ref 11.6–15.4)
WBC: 5.4 10*3/uL (ref 3.4–10.8)

## 2019-10-28 LAB — COMPREHENSIVE METABOLIC PANEL
ALT: 16 IU/L (ref 0–44)
AST: 16 IU/L (ref 0–40)
Albumin/Globulin Ratio: 1.3 (ref 1.2–2.2)
Albumin: 4.3 g/dL (ref 3.8–4.8)
Alkaline Phosphatase: 62 IU/L (ref 44–121)
BUN/Creatinine Ratio: 17 (ref 10–24)
BUN: 16 mg/dL (ref 8–27)
Bilirubin Total: 0.2 mg/dL (ref 0.0–1.2)
CO2: 25 mmol/L (ref 20–29)
Calcium: 9.7 mg/dL (ref 8.6–10.2)
Chloride: 103 mmol/L (ref 96–106)
Creatinine, Ser: 0.93 mg/dL (ref 0.76–1.27)
GFR calc Af Amer: 96 mL/min/{1.73_m2} (ref >59)
GFR calc non Af Amer: 83 mL/min/{1.73_m2} (ref >59)
Globulin, Total: 3.4 g/dL (ref 1.5–4.5)
Glucose: 107 mg/dL — ABNORMAL HIGH (ref 65–99)
Potassium: 4.5 mmol/L (ref 3.5–5.2)
Sodium: 141 mmol/L (ref 134–144)
Total Protein: 7.7 g/dL (ref 6.0–8.5)

## 2019-10-28 LAB — BRAIN NATRIURETIC PEPTIDE: BNP: <2.5 pg/mL (ref 0.0–100.0)

## 2019-10-30 ENCOUNTER — Telehealth: Payer: Self-pay

## 2019-10-30 NOTE — Telephone Encounter (Signed)
Pt would like to know if he should continue on the furosemide due to no change in the swelling. Pt has had MRI. Will have echo 10/31/19 and scheduled with vas. 11/04/19. Please advise [facility]

## 2019-10-31 ENCOUNTER — Ambulatory Visit (HOSPITAL_COMMUNITY): Admission: RE | Admit: 2019-10-31 | Payer: 59 | Source: Ambulatory Visit

## 2019-10-31 ENCOUNTER — Telehealth: Payer: Self-pay

## 2019-10-31 NOTE — Telephone Encounter (Signed)
Pt was called to advise of the reschedule of his echo. Pt new appt is 11/03/19 at 11 am. [facility]

## 2019-10-31 NOTE — Telephone Encounter (Signed)
LVM advising pt and asking him to call back to advise he got the message.KH

## 2019-10-31 NOTE — Telephone Encounter (Signed)
He can stop the Lasix

## 2019-11-03 ENCOUNTER — Ambulatory Visit (HOSPITAL_COMMUNITY)
Admission: RE | Admit: 2019-11-03 | Discharge: 2019-11-03 | Disposition: A | Discharge status: Home / Self Care | Payer: 59 | Source: Ambulatory Visit | Attending: Family Medicine | Admitting: Family Medicine

## 2019-11-03 ENCOUNTER — Other Ambulatory Visit: Payer: Self-pay

## 2019-11-03 DIAGNOSIS — F17200 Nicotine dependence, unspecified, uncomplicated: Secondary | ICD-10-CM | POA: Diagnosis not present

## 2019-11-03 DIAGNOSIS — R609 Edema, unspecified: Secondary | ICD-10-CM

## 2019-11-03 DIAGNOSIS — R600 Localized edema: Secondary | ICD-10-CM | POA: Diagnosis not present

## 2019-11-03 DIAGNOSIS — I119 Hypertensive heart disease without heart failure: Secondary | ICD-10-CM | POA: Insufficient documentation

## 2019-11-03 NOTE — Progress Notes (Signed)
  Echocardiogram 2D Echocardiogram has been performed.  Pieter Partridge 11/03/2019, 11:29 AM

## 2019-11-04 ENCOUNTER — Ambulatory Visit: Payer: 59 | Admitting: Cardiovascular Disease

## 2019-11-06 LAB — ECHOCARDIOGRAM COMPLETE
Area-P 1/2: 3.53 cm2
S' Lateral: 2.3 cm

## 2019-11-12 ENCOUNTER — Telehealth: Payer: Self-pay | Admitting: Family Medicine

## 2019-11-12 NOTE — Telephone Encounter (Signed)
Pt called and states that his legs are still swollen and he wants to know what he can do pt can be reached at [phone] [id number] [place] [place] - [address] Informed pt that you was out of the office

## 2019-11-12 NOTE — Telephone Encounter (Signed)
Talk to me tomorrow.  I think we need to get him into [facility] and see if they have any thoughts on why this is occurring.

## 2019-11-14 NOTE — Telephone Encounter (Signed)
LVM for pt to call back so we can get him in touch with [facility] care. Pt No showed first appt. KH

## 2019-11-19 ENCOUNTER — Telehealth: Payer: Self-pay

## 2019-11-19 NOTE — Telephone Encounter (Signed)
Pt was called to advise of the phone number to call and see if he can be reschedule to his cardio referral. No answer LVM. KH

## 2019-11-26 ENCOUNTER — Encounter: Payer: Self-pay | Admitting: Family Medicine

## 2019-12-08 ENCOUNTER — Other Ambulatory Visit: Payer: Self-pay | Admitting: Medical

## 2020-02-25 ENCOUNTER — Telehealth: Payer: Self-pay

## 2020-02-25 NOTE — Telephone Encounter (Signed)
Pt lvm advising he had received a call form our office. There isn't a note in his chart and I LVM for pt to call back. KH

## 2020-11-03 ENCOUNTER — Other Ambulatory Visit: Payer: Self-pay | Admitting: Medical

## 2020-11-03 DIAGNOSIS — I10 Essential (primary) hypertension: Secondary | ICD-10-CM

## 2020-11-03 NOTE — Telephone Encounter (Signed)
Left message for pt to call back to schedule an appt. He is well overdue

## 2021-08-19 ENCOUNTER — Encounter: Payer: Self-pay | Admitting: Family Medicine

## 2021-09-07 IMAGING — CT CT ABD-PELV W/ CM
2 of 5 series · 16 of 46 positions shown, 18 images · IV contrast (omnipaque)
Comparison: None.

CLINICAL DATA: Bilateral groin swelling and lower extremity edema,
initial encounter

EXAM:
CT ABDOMEN AND PELVIS WITH CONTRAST
TECHNIQUE: Multidetector CT imaging of the abdomen and pelvis was performed
using the standard protocol following bolus administration of
intravenous contrast.
CONTRAST:  100mL OMNIPAQUE IOHEXOL 300 MG/ML  SOLN

[Series 2: axial st · axial · 0.78mm/px · z∈[-459,-74]mm · 13 of 89 slices shown, 15 images]
[im 6/89  soft-tissue]
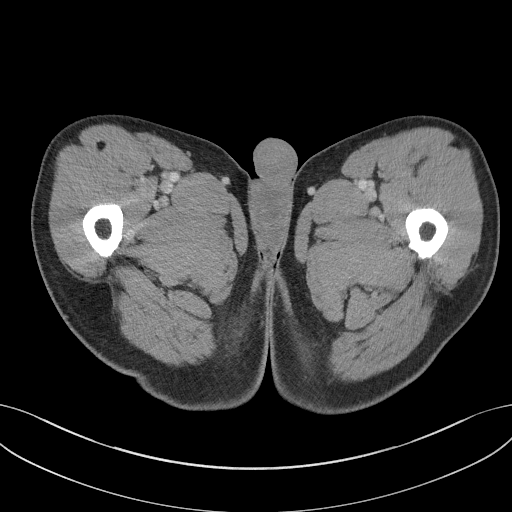
[im 6/89  bone]
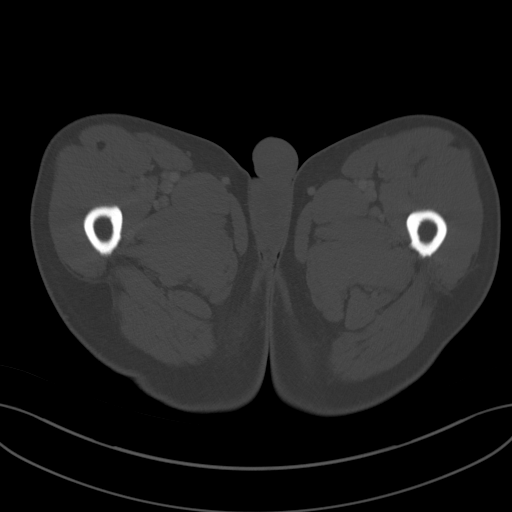
[im 12/89  soft-tissue]
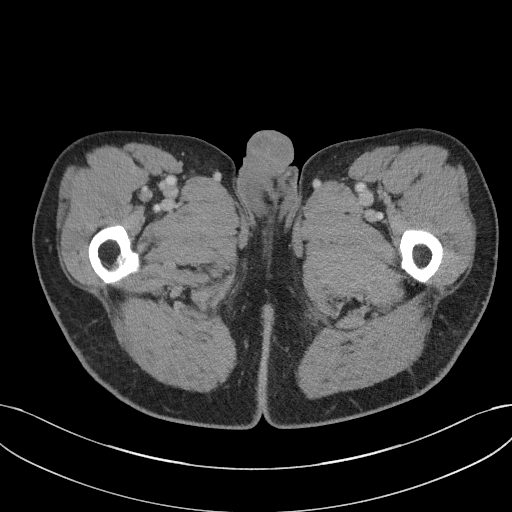
[im 18/89  soft-tissue]
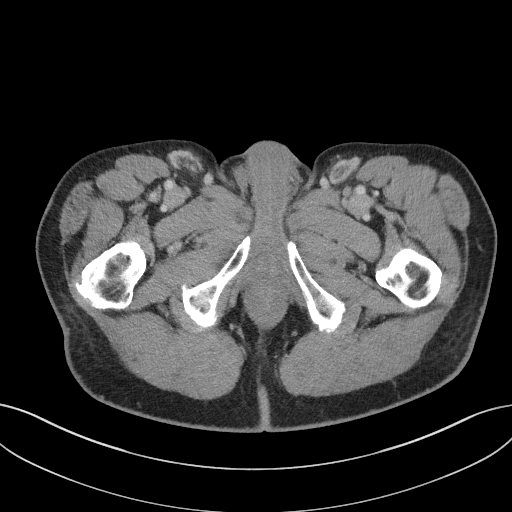
[im 24/89  soft-tissue]
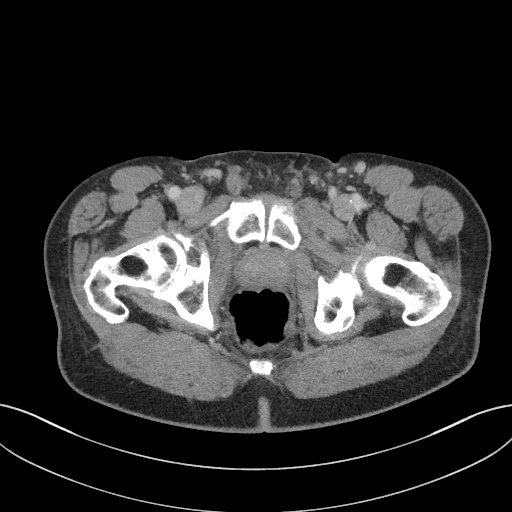
[im 30/89  soft-tissue]
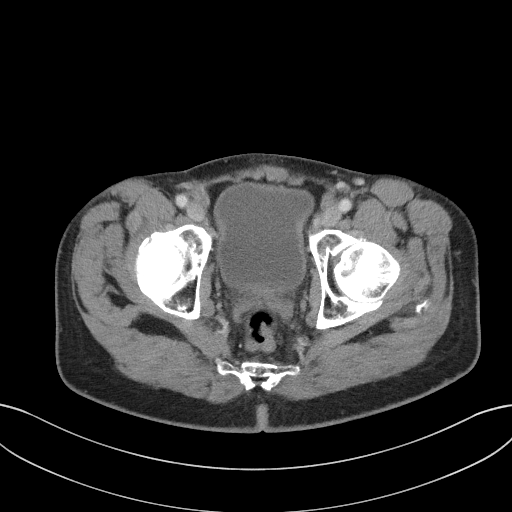
[im 36/89  soft-tissue]
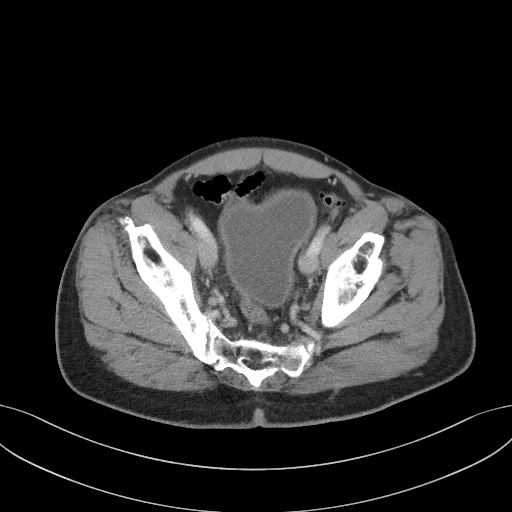
[im 47/89  soft-tissue]
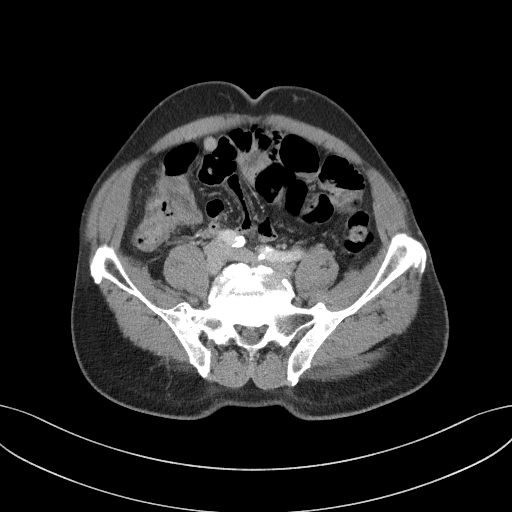
[im 53/89  soft-tissue]
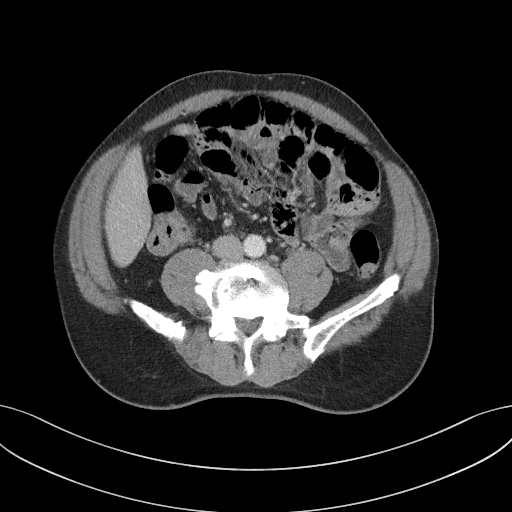
[im 59/89  soft-tissue]
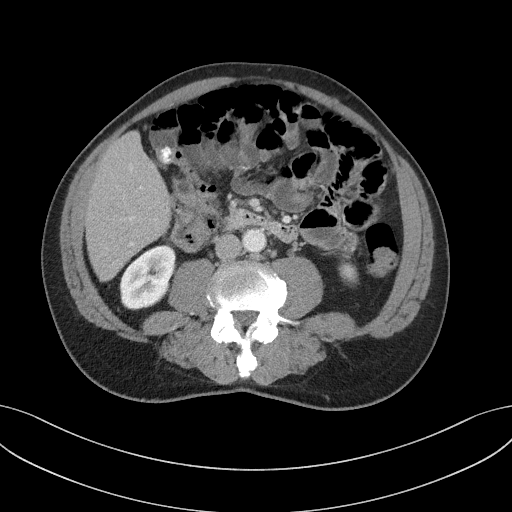
[im 59/89  bone]
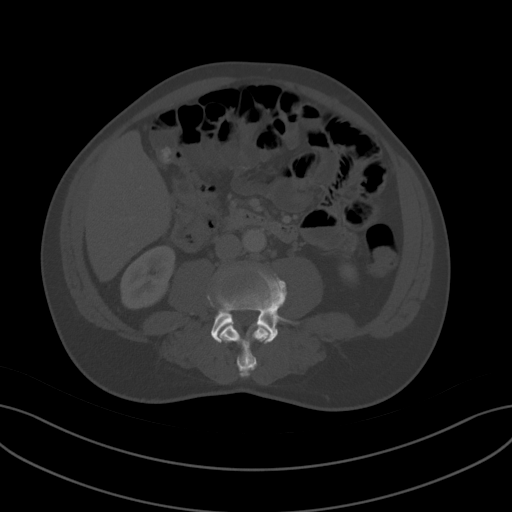
[im 65/89  soft-tissue]
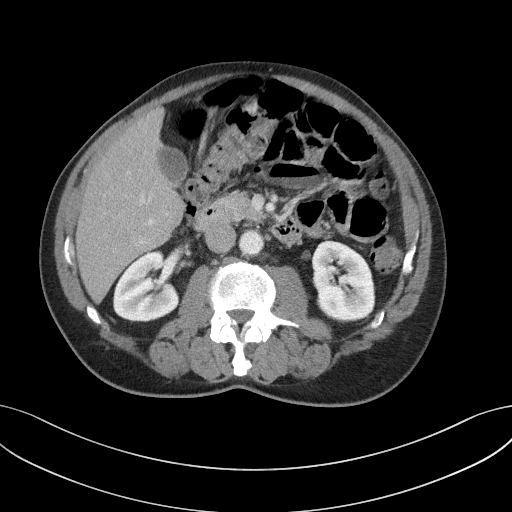
[im 71/89  soft-tissue]
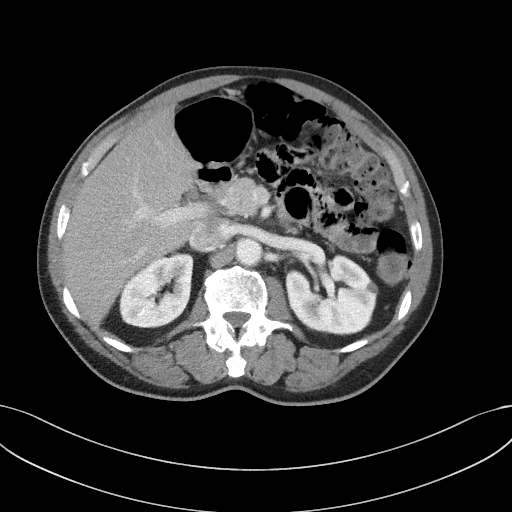
[im 77/89  soft-tissue]
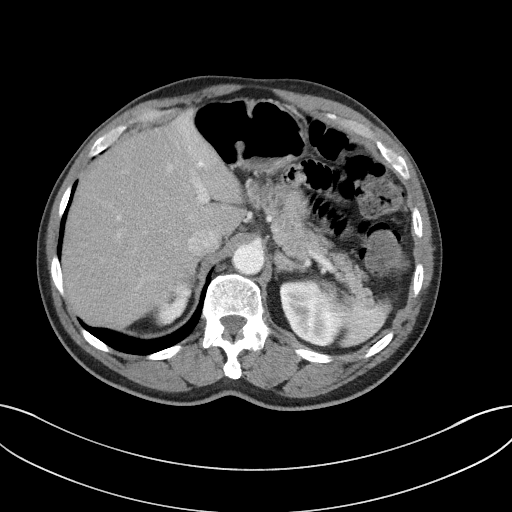
[im 83/89  soft-tissue]
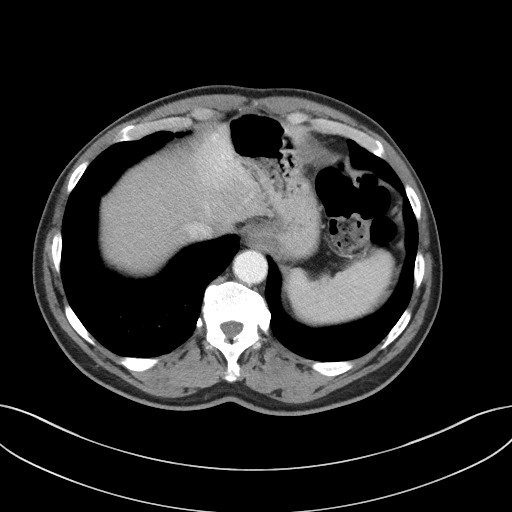

[Series 5: coronal st · coronal · 0.68mm/px · 3 of 105 slices shown]
[im 35/105  soft-tissue]
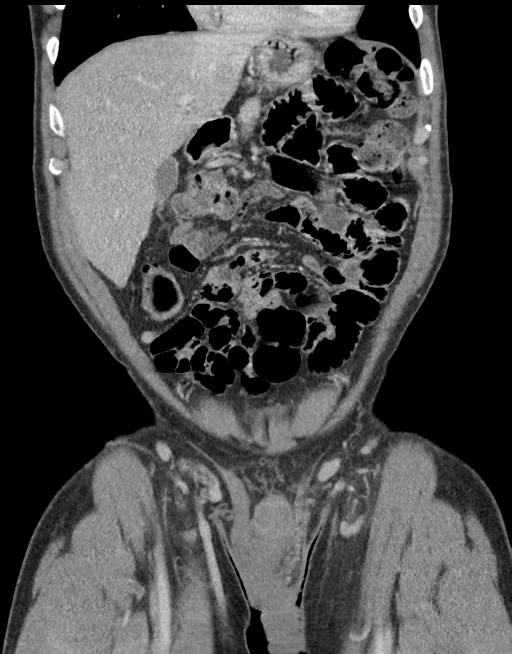
[im 47/105  soft-tissue]
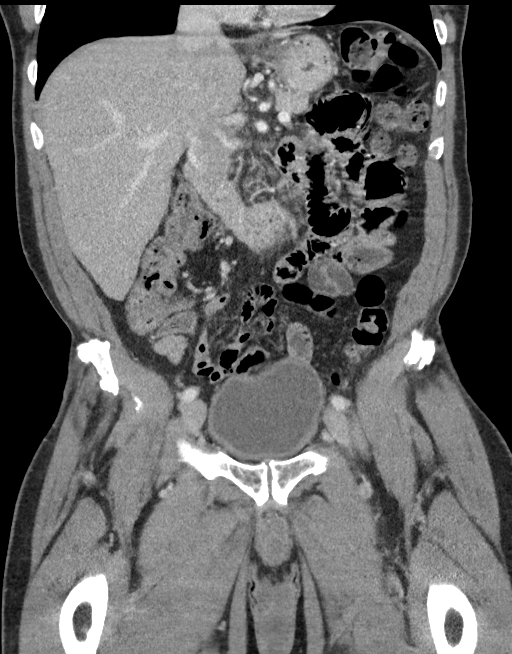
[im 58/105  soft-tissue]
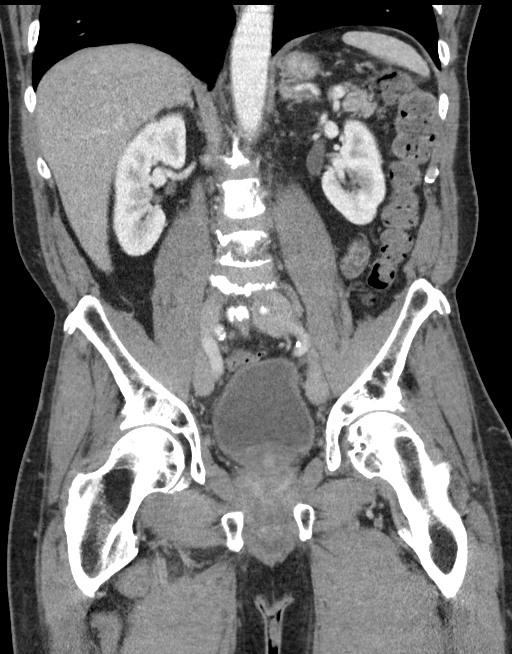

[16 of 46 positions shown; findings below may reference images not displayed]

FINDINGS: Lower chest: Mild emphysematous changes are noted. No acute
infiltrate is seen.

Hepatobiliary: Gallbladder demonstrates some dependent density
likely related to cholelithiasis. The liver demonstrates diffuse
fatty infiltration although a focal hemangioma is noted in the
lateral segment of the left lobe of liver. This is stable in
appearance from the prior exam.

Pancreas: Unremarkable. No pancreatic ductal dilatation or
surrounding inflammatory changes.

Spleen: Normal in size without focal abnormality.

Adrenals/Urinary Tract: Adrenal glands are within normal limits.
Kidneys demonstrate normal enhancement pattern. No renal calculi are
seen. Small cortical cyst is noted on the left. The bladder is well
distended. Normal excretion of contrast on delayed images is seen.

Stomach/Bowel: Colon demonstrates mild diverticular change without
evidence of diverticulitis. The appendix is within normal limits. No
small bowel or gastric abnormality is seen.

Vascular/Lymphatic: Aortic atherosclerosis. No enlarged
intra-abdominal adenopathy is noted. In the inguinal regions there
are prominent lymph nodes bilaterally with normal fatty hila and
relative symmetrical nature.

Reproductive: Prostate is unremarkable.

Other: No abdominal wall hernia or abnormality. No abdominopelvic
ascites.

Musculoskeletal: Degenerative changes of lumbar spine are noted.
IMPRESSION: Symmetrical mildly enlarged lymph nodes in the inguinal regions
bilaterally which correspond with the given clinical history. Normal
fatty hila are noted and these are felt to be reactive related to
the lower extremity swelling. No evidence of deep venous thrombosis
is noted.

Cholelithiasis without complicating factors.

Stable hepatic hemangioma unchanged from 9337.

## 2021-11-21 ENCOUNTER — Telehealth: Payer: Self-pay | Admitting: Family Medicine

## 2021-11-21 ENCOUNTER — Ambulatory Visit: Payer: 59 | Admitting: Family Medicine

## 2021-11-21 NOTE — Telephone Encounter (Signed)
Dismissal letter in guarantor snapshot  °
